# Patient Record
Sex: Female | Born: 1945 | Race: White | Hispanic: No | Marital: Married | Smoking: Former smoker
Health system: Southern US, Community
[De-identification: ages and names within clinical notes are randomized; demographics above are authoritative.]

## PROBLEM LIST (undated history)

## (undated) DIAGNOSIS — E039 Hypothyroidism, unspecified: Secondary | ICD-10-CM

## (undated) DIAGNOSIS — IMO0002 Reserved for concepts with insufficient information to code with codable children: Secondary | ICD-10-CM

## (undated) DIAGNOSIS — M069 Rheumatoid arthritis, unspecified: Secondary | ICD-10-CM

## (undated) DIAGNOSIS — F32A Depression, unspecified: Secondary | ICD-10-CM

## (undated) DIAGNOSIS — F329 Major depressive disorder, single episode, unspecified: Secondary | ICD-10-CM

## (undated) HISTORY — PX: WISDOM TOOTH EXTRACTION: SHX21

## (undated) HISTORY — PX: ABDOMINAL HYSTERECTOMY: SUR658

## (undated) HISTORY — PX: ABDOMINAL HYSTERECTOMY: SHX81

## (undated) HISTORY — PX: TONSILLECTOMY: SUR1361

---

## 1999-02-07 ENCOUNTER — Encounter: Payer: Self-pay | Admitting: Obstetrics and Gynecology

## 1999-02-07 ENCOUNTER — Encounter: Admission: RE | Admit: 1999-02-07 | Discharge: 1999-02-07 | Payer: Self-pay | Admitting: Obstetrics and Gynecology

## 2000-03-06 ENCOUNTER — Encounter: Payer: Self-pay | Admitting: Obstetrics and Gynecology

## 2000-03-06 ENCOUNTER — Encounter: Admission: RE | Admit: 2000-03-06 | Discharge: 2000-03-06 | Payer: Self-pay | Admitting: Obstetrics and Gynecology

## 2001-01-23 ENCOUNTER — Other Ambulatory Visit: Admission: RE | Admit: 2001-01-23 | Discharge: 2001-01-23 | Payer: Self-pay | Admitting: Obstetrics and Gynecology

## 2001-02-28 ENCOUNTER — Encounter: Admission: RE | Admit: 2001-02-28 | Discharge: 2001-02-28 | Payer: Self-pay

## 2001-03-12 ENCOUNTER — Encounter: Payer: Self-pay | Admitting: Obstetrics and Gynecology

## 2001-03-12 ENCOUNTER — Encounter: Admission: RE | Admit: 2001-03-12 | Discharge: 2001-03-12 | Payer: Self-pay | Admitting: Obstetrics and Gynecology

## 2002-03-10 ENCOUNTER — Encounter: Admission: RE | Admit: 2002-03-10 | Discharge: 2002-03-10 | Payer: Self-pay | Admitting: Gynecology

## 2002-03-10 ENCOUNTER — Encounter: Payer: Self-pay | Admitting: Gynecology

## 2002-03-13 ENCOUNTER — Other Ambulatory Visit: Admission: RE | Admit: 2002-03-13 | Discharge: 2002-03-13 | Payer: Self-pay | Admitting: Gynecology

## 2002-06-03 ENCOUNTER — Ambulatory Visit (HOSPITAL_COMMUNITY): Admission: RE | Admit: 2002-06-03 | Discharge: 2002-06-03 | Payer: Self-pay

## 2002-06-16 ENCOUNTER — Encounter: Admission: RE | Admit: 2002-06-16 | Discharge: 2002-06-16 | Payer: Self-pay

## 2002-06-17 ENCOUNTER — Encounter: Admission: RE | Admit: 2002-06-17 | Discharge: 2002-06-17 | Payer: Self-pay

## 2003-03-17 ENCOUNTER — Other Ambulatory Visit: Admission: RE | Admit: 2003-03-17 | Discharge: 2003-03-17 | Payer: Self-pay | Admitting: Gynecology

## 2003-04-02 ENCOUNTER — Encounter: Admission: RE | Admit: 2003-04-02 | Discharge: 2003-04-02 | Payer: Self-pay | Admitting: Gynecology

## 2004-03-17 ENCOUNTER — Other Ambulatory Visit: Admission: RE | Admit: 2004-03-17 | Discharge: 2004-03-17 | Payer: Self-pay | Admitting: Gynecology

## 2004-04-12 ENCOUNTER — Encounter: Admission: RE | Admit: 2004-04-12 | Discharge: 2004-04-12 | Payer: Self-pay | Admitting: Gynecology

## 2004-04-22 ENCOUNTER — Encounter: Admission: RE | Admit: 2004-04-22 | Discharge: 2004-04-22 | Payer: Self-pay | Admitting: Gynecology

## 2004-09-07 ENCOUNTER — Ambulatory Visit (HOSPITAL_COMMUNITY): Admission: RE | Admit: 2004-09-07 | Discharge: 2004-09-07 | Payer: Self-pay | Admitting: Gastroenterology

## 2005-03-28 ENCOUNTER — Other Ambulatory Visit: Admission: RE | Admit: 2005-03-28 | Discharge: 2005-03-28 | Payer: Self-pay | Admitting: Gynecology

## 2005-04-26 ENCOUNTER — Encounter: Admission: RE | Admit: 2005-04-26 | Discharge: 2005-04-26 | Payer: Self-pay | Admitting: Gynecology

## 2005-05-16 ENCOUNTER — Encounter: Admission: RE | Admit: 2005-05-16 | Discharge: 2005-05-16 | Payer: Self-pay

## 2005-08-30 ENCOUNTER — Encounter: Admission: RE | Admit: 2005-08-30 | Discharge: 2005-08-30 | Payer: Self-pay | Admitting: Internal Medicine

## 2006-04-02 ENCOUNTER — Other Ambulatory Visit: Admission: RE | Admit: 2006-04-02 | Discharge: 2006-04-02 | Payer: Self-pay | Admitting: Gynecology

## 2006-04-27 ENCOUNTER — Encounter: Admission: RE | Admit: 2006-04-27 | Discharge: 2006-04-27 | Payer: Self-pay | Admitting: Gynecology

## 2007-04-02 ENCOUNTER — Encounter: Admission: RE | Admit: 2007-04-02 | Discharge: 2007-04-02 | Payer: Self-pay | Admitting: Internal Medicine

## 2007-04-09 ENCOUNTER — Other Ambulatory Visit: Admission: RE | Admit: 2007-04-09 | Discharge: 2007-04-09 | Payer: Self-pay | Admitting: Gynecology

## 2007-04-29 ENCOUNTER — Encounter: Admission: RE | Admit: 2007-04-29 | Discharge: 2007-04-29 | Payer: Self-pay | Admitting: Gynecology

## 2007-05-10 ENCOUNTER — Encounter: Admission: RE | Admit: 2007-05-10 | Discharge: 2007-05-10 | Payer: Self-pay | Admitting: Gynecology

## 2008-04-29 ENCOUNTER — Encounter: Admission: RE | Admit: 2008-04-29 | Discharge: 2008-04-29 | Payer: Self-pay | Admitting: Internal Medicine

## 2008-10-05 ENCOUNTER — Encounter: Admission: RE | Admit: 2008-10-05 | Discharge: 2008-10-05 | Payer: Self-pay | Admitting: Internal Medicine

## 2010-04-30 ENCOUNTER — Encounter: Payer: Self-pay | Admitting: Gynecology

## 2010-05-01 ENCOUNTER — Encounter: Payer: Self-pay | Admitting: Internal Medicine

## 2010-05-18 ENCOUNTER — Other Ambulatory Visit: Payer: Self-pay | Admitting: Internal Medicine

## 2010-05-18 DIAGNOSIS — Z1239 Encounter for other screening for malignant neoplasm of breast: Secondary | ICD-10-CM

## 2010-05-25 ENCOUNTER — Ambulatory Visit
Admission: RE | Admit: 2010-05-25 | Discharge: 2010-05-25 | Disposition: A | Discharge status: Home / Self Care | Payer: Medicare Other | Source: Ambulatory Visit | Attending: Internal Medicine | Admitting: Internal Medicine

## 2010-05-25 DIAGNOSIS — Z1239 Encounter for other screening for malignant neoplasm of breast: Secondary | ICD-10-CM

## 2010-06-01 ENCOUNTER — Ambulatory Visit (HOSPITAL_COMMUNITY): Payer: Medicare Other | Attending: Internal Medicine

## 2010-06-01 DIAGNOSIS — M810 Age-related osteoporosis without current pathological fracture: Secondary | ICD-10-CM | POA: Insufficient documentation

## 2010-07-05 ENCOUNTER — Emergency Department (HOSPITAL_COMMUNITY): Payer: Medicare Other

## 2010-07-05 ENCOUNTER — Inpatient Hospital Stay (HOSPITAL_COMMUNITY)
Admission: EM | Admit: 2010-07-05 | Discharge: 2010-07-12 | DRG: 552 | Disposition: A | Discharge status: Skilled Nursing Facility | Payer: Medicare Other | Attending: Internal Medicine | Admitting: Internal Medicine

## 2010-07-05 DIAGNOSIS — K5909 Other constipation: Secondary | ICD-10-CM | POA: Diagnosis not present

## 2010-07-05 DIAGNOSIS — M545 Low back pain, unspecified: Secondary | ICD-10-CM | POA: Diagnosis present

## 2010-07-05 DIAGNOSIS — M810 Age-related osteoporosis without current pathological fracture: Secondary | ICD-10-CM | POA: Diagnosis present

## 2010-07-05 DIAGNOSIS — S3210XA Unspecified fracture of sacrum, initial encounter for closed fracture: Principal | ICD-10-CM | POA: Diagnosis present

## 2010-07-05 DIAGNOSIS — T40605A Adverse effect of unspecified narcotics, initial encounter: Secondary | ICD-10-CM | POA: Diagnosis not present

## 2010-07-05 DIAGNOSIS — S32509A Unspecified fracture of unspecified pubis, initial encounter for closed fracture: Secondary | ICD-10-CM | POA: Diagnosis present

## 2010-07-05 DIAGNOSIS — S322XXA Fracture of coccyx, initial encounter for closed fracture: Principal | ICD-10-CM | POA: Diagnosis present

## 2010-07-05 DIAGNOSIS — E039 Hypothyroidism, unspecified: Secondary | ICD-10-CM | POA: Diagnosis present

## 2010-07-05 DIAGNOSIS — G8929 Other chronic pain: Secondary | ICD-10-CM | POA: Diagnosis present

## 2010-07-05 DIAGNOSIS — Y92009 Unspecified place in unspecified non-institutional (private) residence as the place of occurrence of the external cause: Secondary | ICD-10-CM

## 2010-07-05 DIAGNOSIS — W06XXXA Fall from bed, initial encounter: Secondary | ICD-10-CM | POA: Diagnosis present

## 2010-07-05 DIAGNOSIS — D72829 Elevated white blood cell count, unspecified: Secondary | ICD-10-CM | POA: Diagnosis present

## 2010-07-05 DIAGNOSIS — M069 Rheumatoid arthritis, unspecified: Secondary | ICD-10-CM | POA: Diagnosis present

## 2010-07-05 DIAGNOSIS — I10 Essential (primary) hypertension: Secondary | ICD-10-CM | POA: Diagnosis present

## 2010-07-05 DIAGNOSIS — Z79899 Other long term (current) drug therapy: Secondary | ICD-10-CM

## 2010-07-05 DIAGNOSIS — E871 Hypo-osmolality and hyponatremia: Secondary | ICD-10-CM | POA: Diagnosis present

## 2010-07-05 DIAGNOSIS — Z7982 Long term (current) use of aspirin: Secondary | ICD-10-CM

## 2010-07-05 LAB — COMPREHENSIVE METABOLIC PANEL
ALT: 36 U/L — ABNORMAL HIGH (ref 0–35)
Albumin: 3.7 g/dL (ref 3.5–5.2)
Alkaline Phosphatase: 51 U/L (ref 39–117)
Chloride: 95 mEq/L — ABNORMAL LOW (ref 96–112)
Glucose, Bld: 145 mg/dL — ABNORMAL HIGH (ref 70–99)
Potassium: 4.4 mEq/L (ref 3.5–5.1)
Sodium: 130 mEq/L — ABNORMAL LOW (ref 135–145)
Total Protein: 6.3 g/dL (ref 6.0–8.3)

## 2010-07-05 LAB — CBC
HCT: 35.1 % — ABNORMAL LOW (ref 36.0–46.0)
Hemoglobin: 12.4 g/dL (ref 12.0–15.0)
MCH: 32.6 pg (ref 26.0–34.0)
RBC: 3.8 MIL/uL — ABNORMAL LOW (ref 3.87–5.11)

## 2010-07-05 LAB — SAMPLE TO BLOOD BANK

## 2010-07-05 LAB — DIFFERENTIAL
Basophils Relative: 0 % (ref 0–1)
Lymphocytes Relative: 4 % — ABNORMAL LOW (ref 12–46)
Monocytes Relative: 4 % (ref 3–12)
Neutro Abs: 12.4 10*3/uL — ABNORMAL HIGH (ref 1.7–7.7)
Neutrophils Relative %: 92 % — ABNORMAL HIGH (ref 43–77)

## 2010-07-05 LAB — URINALYSIS, ROUTINE W REFLEX MICROSCOPIC
Glucose, UA: NEGATIVE mg/dL
Hgb urine dipstick: NEGATIVE
Ketones, ur: 15 mg/dL — AB
Protein, ur: NEGATIVE mg/dL

## 2010-07-06 ENCOUNTER — Inpatient Hospital Stay (HOSPITAL_COMMUNITY): Payer: Medicare Other

## 2010-07-06 LAB — URINALYSIS, MICROSCOPIC ONLY
Ketones, ur: 15 mg/dL — AB
Leukocytes, UA: NEGATIVE
Nitrite: NEGATIVE
Protein, ur: NEGATIVE mg/dL
pH: 7.5 (ref 5.0–8.0)

## 2010-07-06 LAB — DIFFERENTIAL
Basophils Absolute: 0 10*3/uL (ref 0.0–0.1)
Basophils Relative: 0 % (ref 0–1)
Eosinophils Relative: 0 % (ref 0–5)
Monocytes Absolute: 0.4 10*3/uL (ref 0.1–1.0)

## 2010-07-06 LAB — VITAMIN B12: Vitamin B-12: 1068 pg/mL — ABNORMAL HIGH (ref 211–911)

## 2010-07-06 LAB — TSH: TSH: 1.973 u[IU]/mL (ref 0.350–4.500)

## 2010-07-06 LAB — CBC
MCHC: 35.4 g/dL (ref 30.0–36.0)
Platelets: 142 10*3/uL — ABNORMAL LOW (ref 150–400)
RDW: 13.1 % (ref 11.5–15.5)
WBC: 6.5 10*3/uL (ref 4.0–10.5)

## 2010-07-06 LAB — URINE CULTURE

## 2010-07-07 ENCOUNTER — Inpatient Hospital Stay (HOSPITAL_COMMUNITY): Payer: Medicare Other

## 2010-07-07 LAB — BASIC METABOLIC PANEL
CO2: 26 mEq/L (ref 19–32)
Chloride: 97 mEq/L (ref 96–112)
GFR calc non Af Amer: >60 mL/min (ref >60)
Glucose, Bld: 92 mg/dL (ref 70–99)
Potassium: 4.1 mEq/L (ref 3.5–5.1)
Sodium: 132 mEq/L — ABNORMAL LOW (ref 135–145)

## 2010-07-07 LAB — CBC
HCT: 35 % — ABNORMAL LOW (ref 36.0–46.0)
Hemoglobin: 12.4 g/dL (ref 12.0–15.0)
RBC: 3.78 MIL/uL — ABNORMAL LOW (ref 3.87–5.11)
RDW: 12.9 % (ref 11.5–15.5)
WBC: 6.4 10*3/uL (ref 4.0–10.5)

## 2010-07-09 LAB — BASIC METABOLIC PANEL
CO2: 29 mEq/L (ref 19–32)
Calcium: 8.9 mg/dL (ref 8.4–10.5)
Glucose, Bld: 93 mg/dL (ref 70–99)
Potassium: 4.7 mEq/L (ref 3.5–5.1)
Sodium: 133 mEq/L — ABNORMAL LOW (ref 135–145)

## 2010-07-09 LAB — CBC
HCT: 34.9 % — ABNORMAL LOW (ref 36.0–46.0)
Hemoglobin: 12.3 g/dL (ref 12.0–15.0)
MCHC: 35.2 g/dL (ref 30.0–36.0)
MCV: 92.8 fL (ref 78.0–100.0)

## 2010-07-13 NOTE — Discharge Summary (Signed)
[name], KNIPPENBERG                ACCOUNT NO.:  [id number]  MEDICAL RECORD NO.:  [id number]           PATIENT TYPE:  I  LOCATION:  5151                         FACILITY:  [facility]  PHYSICIAN:  Thad Ranger, MD       DATE OF BIRTH:  [birth year]  DATE OF ADMISSION:  07/05/2010 DATE OF DISCHARGE:                              DISCHARGE SUMMARY   PRIMARY CARE PHYSICIAN:  Juline Patch, MD  DISCHARGE DIAGNOSES: 1. Mechanical fall. 2. Nondisplaced right superior and inferior pubic rami fractures. 3. Right sacral ala fracture. 4. Hypertension. 5. Hypothyroidism. 6. History of rheumatoid arthritis. 7. History of chronic low back pain. 8. Hypothyroidism. 9. Constipation.  CONSULTATIONS:  Orthopedics, Toni Arthurs, MD  DISCHARGE MEDICATIONS: 1. Tylenol Extra Strength 500 mg p.o. t.i.d. as needed for pain. 2. Colace 100 mg p.o. daily. 3. Robaxin 500 mg p.o. every 6 hours as needed for pain. 4. Percocet 7.5/325 mg 1-2 tablets p.o. every 6 hours as needed for     pain, 15 tablets dispensed. Facility MD to refill. 5. MiraLax 17 g p.o. daily as needed. 6. Senokot-S 2 tablets p.o. daily. 7. Aspirin 81 mg daily. 8. Bystolic 5 mg p.o. daily at bedtime. 9. Calcium citrate/vitamin D 1 tablet p.o. b.i.d. 10.Cyclobenzaprine 10 mg p.o. b.i.d. as needed for pain. 11.Cymbalta 60 mg p.o. daily. 12.Enbrel (etanercept) 50 mg intramuscular Tuesday. 13.Fluticasone 50 mcg each nostril daily at bedtime. 14.Folic acid 0.4 mg p.o. daily. 15.Levoxyl 112 mcg 1 tablet p.o. daily. 16.Lidoderm patch 2 patches transdermal daily as needed. 17.Meloxicam 15 mg p.o. q.a.m. 18.Methotrexate 2.5 mg 6 tablets p.o. Monday. 19.Plaquenil (hydroxychloroquine) 200 mg p.o. b.i.d. 20.Prenatal plus vitamin 1 tablet p.o. daily. 21.Tramadol 50 mg p.o. twice a day as needed for pain. 22.Vitamin D3 1000 units 1 tablet p.o. twice daily. 23.Zovirax topical daily as needed when cold sores are present.  BRIEF HISTORY OF PRESENT  ILLNESS:  At the time of admission, [name] is a 65 year old female with history of rheumatoid arthritis, osteoporosis, hypothyroidism who fell a day before the admission while getting out of the bed.  She was unable to get up after falling.  She called the EMS and was brought to the emergency room.  She was found to have nondisplaced superior and inferior pubic rami fractures.  The patient also reported recurrent falls over the last few months, at least 4 different episodes, blamed on loss of balance and poor coordination.  RADIOLOGICAL DATA: 1. Chest x-ray, March 27.  Peribronchial thickening which can be seen     with a chronic bronchitis. 2. Right hip x-ray, March 27.  Nondisplaced superior and inferior     medial right pubic rami fractures. 3. CT pelvis without contrast, March 27.  Acute fractures of the right     superior and inferior pubic rami and associated right sacral ala     fracture, 2.5 cm right adnexal cystic lesion.  Pelvic ultrasound     could be used to further characterize. 4. Pelvic ultrasound showed 2.9-cm cyst in the right ovary shows a     thin internal peripheral septations.  No overtly suspicious  feature     such as mural nodular papillary excrescences.  Followup ultrasound     in 6 weeks recommended. 5. Right knee x-ray negative. 6. Right tibia-fibula x-ray negative for any fractures. 7. Right ankle x-ray negative. 8. Pelvic x-ray on March 29.  No evidence of distraction involving the     superior and inferior right pubic rami fractures.  S1 joint is     intact.  BRIEF HOSPITALIZATION COURSE:  [name] is a 65 year old female who presented with mechanical fall and subsequent pelvic fracture. 1. Mechanical fall with right superior and inferior nondisplaced     pelvic fracture with right sacral ala fracture.  The patient was     admitted to the Medicine Service and Orthopedic was consulted and     the patient was seen and followed by Dr. Toni Arthurs.   The patient     underwent multiple imaging as she continued to complain of pain     which did not show any other fractures.  She was started on     physical therapy.  Weightbearing as tolerated and she requires a     short-term rehab for strengthening.  She will follow up in 2 weeks     with Dr. Toni Arthurs. 2. Constipation secondary to pain medication.  The patient did have     significant constipation during the hospitalization.  She is doing     well on stool softeners.  Continue Colace and Senokot-S tablet     daily with p.r.n. MiraLax. 3. History of rheumatoid arthritis.  The patient is on etanercept,     Plaquenil and methotrexate. The patient will be discharged to short-     term rehab today, pending bed available.  PHYSICAL EXAMINATION AT TIME OF DICTATION:  VITAL SIGNS:  Temperature 98.2, pulse 68, respirations 16, blood pressure 113/62, O2 sats 97% on room air. GENERAL:  The patient is alert, awake and oriented x3 not in acute distress. HEENT:  Anicteric sclarea, pink conjunctiva.  Pupils reactive to light accommodation.  EOMI.  NECK:  Supple.  No lymphopathy.  No JVD. CVS:  S1 and S2 clear.  Regular rate and rhythm. CHEST:  Clear to auscultation bilaterally. ABDOMEN:  Soft, nontender, and nondistended.  Normal bowel sounds. EXTREMITIES:  No cyanosis, clubbing, or edema noted in the upper or lower extremities bilaterally.  DISCHARGE FOLLOWUP: 1. With Dr. Juline Patch in 3-4 weeks, outpatient pelvic ultrasound in     6 weeks. 2. Dr. Toni Arthurs in 2 weeks.  DISCHARGE TIME:  35 minutes.     Thad Ranger, MD     RR/MEDQ  D:  07/11/2010  T:  07/11/2010  Job:  [id number]  cc:   Juline Patch, M.D. Toni Arthurs, MD  Electronically Signed by Andres Labrum Nadir Vasques  on 07/13/2010 04:46:28 PM

## 2010-07-18 NOTE — H&P (Signed)
[name], [name]                ACCOUNT NO.:  [id number]  MEDICAL RECORD NO.:  [id number]           PATIENT TYPE:  E  LOCATION:  [facility]                         FACILITY:  [facility]  PHYSICIAN:  Homero Fellers, MD   DATE OF BIRTH:  October 24, 1945  DATE OF ADMISSION:  07/05/2010 DATE OF DISCHARGE:                             HISTORY & PHYSICAL   PRIMARY CARE PHYSICIAN:  Juline Patch, MD  CHIEF COMPLAINT:  Fall.  HISTORY OF PRESENT ILLNESS:  This is a 65 year old woman who fell from a height bed to a carpeted floor and sustained injury to her right hip region.  When EMS arrived, the patient was given fentanyl and Zofran and in the emergency room she got full evaluation including x-ray of the right hip which showed nondisplaced superior and inferior medial right pubic rami.  She also had CAT scan of the pelvis without contrast which showed no evidence of pelvic organ damage and confirmed undisplaced fracture of the right superior and inferior pubic rami as described above.  Incidentally, there was also found a 2.5-cm right adnexal cystic lesion.  The patient denies any chest pain, shortness of breath, nausea or vomiting.  She also denies any loss of consciousness prior to fall. Aforementioned incident was described by the patient trying to answer the telephone and then she lost her balance and fell.  The patient has also had recurrent falls over the past few months at least 4 different episodes including today's episode of blamed on loss of balance and probably poor coordination.  PAST MEDICAL HISTORY:  Includes 1. High blood pressure. 2. Hypothyroidism. 3. Rheumatoid arthritis. 4. History of low back pain, status post steroid injection about 6     weeks ago.  MEDICATION.: 1. Plaquenil. 2. Methotrexate. 3. Enbrel. 4. Cymbalta. 5. Multivitamin. 6. Vitamin D3. 7. Calcium citrate. 8. Folic acid. 9.  Aspirin. 10.Cyclobenzaprine. 11.Levoxyl. 12.Bystolic. 13.Tramadol. 14.Meloxicam.  ALLERGIES:  SULFA.  SOCIAL HISTORY:  No smoking, alcohol or drug use.  FAMILY HISTORY:  Noncontributory.  REVIEW OF SYSTEMS:  A 10-point review of systems negative except as above.  PHYSICAL EXAMINATION:  VITAL SIGNS:  Blood pressure is 127/66, pulse 79, respirations 18, temperature 97.6, O2 sat 95%. GENERAL:  The patient is comfortable in no distress. HEENT:  Pallor.  Extraocular muscles are intact. NECK:  Supple.  No JVD, adenopathy or thyromegaly. LUNGS:  Clear to auscultation bilaterally.  No murmurs, rubs or gallops. ABDOMEN:  Full, soft, nondistended.  Bowel sounds present.  No masses. EXTREMITIES:  No edema, clubbing or cyanosis. NEUROLOGIC:  Alert and oriented x3.  No focal deficit identified. Speech is clear. MUSCULOSKELETAL:  She has some tenderness along the spine. SKIN:  No rash or lesion.  LABORATORY:  White count is 13.5 with a left shift, hemoglobin 12.41, platelet count is 149,000.  Sodium is 130, potassium 4.4, BUN 9, creatinine 0.79.  Liver enzymes normal.  Urinalysis pending.  Imaginghas been described above.  EKG normal sinus rhythm.  ASSESSMENT: 1. This 65 year old woman admitted after a fall following which she     sustained nondisplaced fracture to the right superior and inferior  pubic rami and right sacral ala fracture. 2. Leukocytosis of unclear etiology. 3. Right adnexal cystic lesion. 4. History of high blood pressure, fairly stable. 5. Hypothyroidism.  PLAN:  Admit to medical bed, start pain control and physical therapy. Orthopedic Service had been notified and will also see the patient.  I will also order a 2-D echo in view of recurrent falls.  I will also a get a TSH, B12, and folic acid levels.  Urinalysis is pending.  Overall condition is stable.  She will be on DVT prophylaxis.     Homero Fellers, MD     FA/MEDQ  D:  07/05/2010  T:   07/05/2010  Job:  [id number]  Electronically Signed by Homero Fellers  on 07/18/2010 09:15:33 PM

## 2010-07-29 NOTE — Consult Note (Signed)
[name], [name]                ACCOUNT NO.:  [id number]  MEDICAL RECORD NO.:  [id number]           PATIENT TYPE:  I  LOCATION:  5151                         FACILITY:  [facility]  PHYSICIAN:  Mary Arthurs, MD        DATE OF BIRTH:  07/13/1945  DATE OF CONSULTATION:  07/06/2010 DATE OF DISCHARGE:                                CONSULTATION   REASON FOR CONSULTATION:  Hip pain.  PRIMARY CARE PROVIDER:  Juline Patch, MD  RHEUMATOLOGIST:  Mary Deeds, MD  HISTORY OF PRESENT ILLNESS:  The patient is a 65 year old female with past medical history significant for rheumatoid arthritis and osteoporosis who fell yesterday morning getting out of bed.  She was unable to get up having fallen.  She called EMS.  She was brought to the emergency room and evaluated for this injury.  She was found to have nondisplaced superior and inferior pubic ramus fractures.  I was consulted for further evaluation of this injury.  The patient complains of dull aching pain deep in her right hip.  She says this is worse with moving her right leg around.  It feels better when she lays still.  She has not ambulated since her injury.  She denies numbness, tingling, or weakness in her right lower extremity. She recently started infusion therapy for her osteoporosis directed by [name].  She is not a smoker.  She is not diabetic.  PAST MEDICAL HISTORY:  Hypertension, hypothyroidism, rheumatoid arthritis, low back pain treated most recently by epidural steroid injection by [name] at [facility] and Sports Medicine. Osteoporosis.  SOCIAL HISTORY:  The patient is retired.  She does not use any tobacco products.  FAMILY HISTORY:  Positive for coronary artery disease.  REVIEW OF SYSTEMS:  No recent fever, chills, nausea, vomiting, or changes in her appetite.  Review of systems as above and otherwise negative.  PHYSICAL EXAMINATION:  The patient is well-nourished, well-developed woman in no  apparent distress, alert and oriented x4.  Mood and affect normal.  Extraocular motions are intact.  Respirations are unlabored. She is examined lying supine on a hospital bed.  The patient has no pain with anterior-posterior compression of her pelvis, but she does have some pain with medial lateral compression at greater trochanters.  The right hip has full range of motion with some mild pain passively.  With straight leg raises, it recreates the pain in the groin.  She has healthy and intact skin over the right lower extremity.  She has palpable pulses and normal sensibility to light touch throughout.  There is no lymphadenopathy noted about the right lower extremity.  She has 5/5 strength throughout as well.  X-rays, AP pelvis as well as CT scan are reviewed.  These show nondisplaced right sacral ala fracture as well as superior and inferior pubic ramus fractures on the right.  ASSESSMENT:  Nondisplaced pelvic ring fractures in a patient with osteoporosis and rheumatoid arthritis.  PLAN:  I explained the nature of these conditions to the patient in detail.  Based on her fracture pattern, this is likely to be stable injury.  Today we will start her with physical therapy, weightbearing as tolerated on both lower extremities.  We will follow up with an AP pelvis as well as inlet and outlet views tomorrow morning after she has been up and around today.  If her films show no significant change in position of the fracture fragments, then she can be weightbearing as tolerated as these fractures heal.  I will see her again tomorrow to assess how she is progressing.  This represents a significant injury for this patient with high risk of loss of functional ability.     Mary Arthurs, MD     JH/MEDQ  D:  07/06/2010  T:  07/06/2010  Job:  [id number]  cc:   Mary Deeds, MD [name], M.D.  Electronically Signed by [name]  on 07/29/2010 10:29:54 PM

## 2010-08-26 NOTE — Op Note (Signed)
[name], [name]                ACCOUNT NO.:  [id number]   MEDICAL RECORD NO.:  [id number]          PATIENT TYPE:  AMB   LOCATION:  ENDO                         FACILITY:  [facility]   PHYSICIAN:  Anselmo Rod, M.D.  DATE OF BIRTH:  1945/12/18   DATE OF PROCEDURE:  09/07/2004  DATE OF DISCHARGE:                                 OPERATIVE REPORT   PROCEDURE:  Screening colonoscopy.   ENDOSCOPIST:  Anselmo Rod, M.D.   INSTRUMENT:  Olympus videocolonoscope.   INDICATIONS FOR PROCEDURE:  A 65 year old white female with a history of  chronic constipation and occasional rectal bleeding undergoing screening  colonoscopy to rule out colonic polyps, masses, etcetera.   PREPROCEDURE PREPARATION:  Informed consent was procured from the patient.  The patient fasted for 8 hours prior to the procedure and prepped with a  bottle of magnesium citrate in a gallon of GoLYTELY the night prior to the  procedure.  The risks and benefits of the procedure including a 10% missed  rate of cancer and polyps were discussed with the patient.   PREPROCEDURE PHYSICAL:  VITAL SIGNS: The patient with stable vital signs.  NECK:  Supple.  CHEST:  Clear to auscultation.  HEART:  S1, S2 regular.  ABDOMEN:  Soft with normal bowel sounds.   DESCRIPTION OF PROCEDURE:  The patient was placed in the left lateral  decubitus position and sedated with 50 mg of Demerol and 5 mg of Versed in  slow incremental doses. Once the patient was adequately sedated, maintained  on low-flow oxygen, and continuous cardiac monitoring; the Olympus  videocolonoscope was advanced from the rectum to the cecum with difficulty.  There was a large amount of residual stool in the colon.  Multiple washes  were done, small lesions could be missed.  The appendiceal orifice and  ileocecal valve were visualized and photographed.  The terminal ileum was  briefly visualized and appeared normal.  There was no evidence of masses,  polyps, or  diverticulosis.   Retroflexion in the rectum revealed small internal hemorrhoids.  Small  external hemorrhoids were seen on withdrawal of the scope.  The patient  tolerated the procedure well without complications.   IMPRESSION:  1. Unrevealing colonoscopy up to the terminal ileum except for small      internal and external hemorrhoids.  2. Large amount of residual stool in the colon.  Small lesions could be      missed.  3. No masses, polyps, or diverticula seen.     RECOMMENDATIONS:  1. Continue a high fiber diet with liberal fluid intake.  2. Use stool softeners like Colace as need arises.  3. A repeat colonoscopy in the next 5 years or earlier if the patient      develops any abnormal symptoms in the interim.        JNM/MEDQ  D:  09/07/2004  T:  09/07/2004  Job:  [id number]   cc:   Gretta Cool, M.D.  [address] [place]  [place] [address]  Fax: [phone]

## 2010-09-30 ENCOUNTER — Other Ambulatory Visit: Payer: Self-pay | Admitting: Internal Medicine

## 2010-09-30 DIAGNOSIS — N949 Unspecified condition associated with female genital organs and menstrual cycle: Secondary | ICD-10-CM

## 2010-10-03 ENCOUNTER — Ambulatory Visit
Admission: RE | Admit: 2010-10-03 | Discharge: 2010-10-03 | Disposition: A | Discharge status: Home / Self Care | Payer: Medicare Other | Source: Ambulatory Visit | Attending: Internal Medicine | Admitting: Internal Medicine

## 2010-10-03 DIAGNOSIS — N949 Unspecified condition associated with female genital organs and menstrual cycle: Secondary | ICD-10-CM

## 2010-11-01 ENCOUNTER — Ambulatory Visit (HOSPITAL_COMMUNITY)
Admission: RE | Admit: 2010-11-01 | Discharge: 2010-11-01 | Disposition: A | Discharge status: Home / Self Care | Payer: Medicare Other | Source: Ambulatory Visit | Attending: Internal Medicine | Admitting: Internal Medicine

## 2010-11-01 DIAGNOSIS — G43909 Migraine, unspecified, not intractable, without status migrainosus: Secondary | ICD-10-CM | POA: Insufficient documentation

## 2010-11-01 DIAGNOSIS — Z9181 History of falling: Secondary | ICD-10-CM | POA: Insufficient documentation

## 2010-11-02 NOTE — Procedures (Signed)
EEG NUMBER:  HISTORY:  A 66 year old female with history of migraines and falls.  MEDICATIONS:  Plaquenil, methotrexate, Enbrel, Cymbalta, Prenatal Plus, vitamin D3, citrate, folic acid, aspirin, Levoxyl, Lidoderm, tramadol, and fluticasone propionate.  CONDITIONS OF RECORDING:  This is a 16-channel EEG carried out with the patient in the awake state.  DESCRIPTION:  The waking background activity consists of a low-voltage, symmetrical, fairly well-organized 9 Hz alpha activity seen from the parieto-occipital and posterior temporal regions.  Low-voltage, fast activity, poorly organized was seen anteriorly and at times superimposed on more posterior rhythms.  A mixture of theta and alpha rhythm was seen from the central and temporal regions.  The patient does not drowse or sleep.  Hypoventilation was performed and produced a mild-to-moderate buildup but failed to elicit any abnormalities.  Intermittent photic stimulation was performed and elicited a symmetrical driving response but failed to elicit any abnormalities.  IMPRESSION:  This is a normal awake EEG.  COMMENT:  In EEG, the patient sleep deprived to elicit drowsing and light sleep, may be desirable to further elicit a possible seizure disorder.          ______________________________ Thana Farr, MD    ZO:XWRU D:  11/01/2010 17:55:34  T:  11/02/2010 00:09:02  Job #:  [id number]

## 2011-02-14 ENCOUNTER — Ambulatory Visit: Payer: Medicare Other | Attending: Neurology | Admitting: Physical Therapy

## 2011-02-14 DIAGNOSIS — M6281 Muscle weakness (generalized): Secondary | ICD-10-CM | POA: Insufficient documentation

## 2011-02-14 DIAGNOSIS — R269 Unspecified abnormalities of gait and mobility: Secondary | ICD-10-CM | POA: Insufficient documentation

## 2011-02-14 DIAGNOSIS — IMO0001 Reserved for inherently not codable concepts without codable children: Secondary | ICD-10-CM | POA: Insufficient documentation

## 2011-02-20 ENCOUNTER — Ambulatory Visit: Payer: Medicare Other | Admitting: Physical Therapy

## 2011-02-22 ENCOUNTER — Ambulatory Visit: Payer: Medicare Other | Admitting: Physical Therapy

## 2011-02-27 ENCOUNTER — Ambulatory Visit: Payer: Medicare Other | Admitting: Physical Therapy

## 2011-03-06 ENCOUNTER — Ambulatory Visit: Payer: Medicare Other | Admitting: Physical Therapy

## 2011-03-08 ENCOUNTER — Ambulatory Visit: Payer: Medicare Other | Admitting: Physical Therapy

## 2011-03-13 ENCOUNTER — Ambulatory Visit: Payer: Medicare Other | Attending: Neurology | Admitting: Physical Therapy

## 2011-03-13 DIAGNOSIS — M6281 Muscle weakness (generalized): Secondary | ICD-10-CM | POA: Insufficient documentation

## 2011-03-13 DIAGNOSIS — IMO0001 Reserved for inherently not codable concepts without codable children: Secondary | ICD-10-CM | POA: Insufficient documentation

## 2011-03-13 DIAGNOSIS — R269 Unspecified abnormalities of gait and mobility: Secondary | ICD-10-CM | POA: Insufficient documentation

## 2011-03-15 ENCOUNTER — Ambulatory Visit: Payer: Medicare Other | Admitting: Physical Therapy

## 2011-03-20 ENCOUNTER — Ambulatory Visit: Payer: Medicare Other | Admitting: Physical Therapy

## 2011-03-22 ENCOUNTER — Encounter: Payer: Medicare Other | Admitting: Physical Therapy

## 2011-03-24 ENCOUNTER — Ambulatory Visit: Payer: Medicare Other | Admitting: Rehabilitative and Restorative Service Providers"

## 2011-03-27 ENCOUNTER — Ambulatory Visit: Payer: Medicare Other | Admitting: Physical Therapy

## 2011-03-30 ENCOUNTER — Ambulatory Visit: Payer: Medicare Other | Admitting: Physical Therapy

## 2011-04-12 ENCOUNTER — Ambulatory Visit: Payer: Medicare Other | Attending: Neurology | Admitting: Physical Therapy

## 2011-04-12 DIAGNOSIS — IMO0001 Reserved for inherently not codable concepts without codable children: Secondary | ICD-10-CM | POA: Insufficient documentation

## 2011-04-12 DIAGNOSIS — R269 Unspecified abnormalities of gait and mobility: Secondary | ICD-10-CM | POA: Insufficient documentation

## 2011-04-12 DIAGNOSIS — M6281 Muscle weakness (generalized): Secondary | ICD-10-CM | POA: Insufficient documentation

## 2011-04-14 ENCOUNTER — Ambulatory Visit: Payer: Medicare Other | Admitting: Physical Therapy

## 2011-04-17 ENCOUNTER — Other Ambulatory Visit: Payer: Self-pay | Admitting: Neurology

## 2011-04-17 ENCOUNTER — Ambulatory Visit: Payer: Medicare Other | Admitting: Physical Therapy

## 2011-04-17 DIAGNOSIS — R2689 Other abnormalities of gait and mobility: Secondary | ICD-10-CM

## 2011-04-19 ENCOUNTER — Ambulatory Visit: Payer: Medicare Other | Admitting: Physical Therapy

## 2011-04-20 ENCOUNTER — Other Ambulatory Visit: Payer: Medicare Other

## 2011-04-24 ENCOUNTER — Ambulatory Visit: Payer: Medicare Other | Admitting: Physical Therapy

## 2011-04-25 ENCOUNTER — Ambulatory Visit
Admission: RE | Admit: 2011-04-25 | Discharge: 2011-04-25 | Disposition: A | Discharge status: Home / Self Care | Payer: Medicare Other | Source: Ambulatory Visit | Attending: Neurology | Admitting: Neurology

## 2011-04-25 DIAGNOSIS — R2689 Other abnormalities of gait and mobility: Secondary | ICD-10-CM

## 2011-04-27 ENCOUNTER — Encounter: Payer: Medicare Other | Admitting: Physical Therapy

## 2011-05-01 ENCOUNTER — Encounter: Payer: Medicare Other | Admitting: Physical Therapy

## 2011-05-04 ENCOUNTER — Encounter: Payer: Medicare Other | Admitting: Physical Therapy

## 2011-05-08 ENCOUNTER — Encounter: Payer: Medicare Other | Admitting: Physical Therapy

## 2011-05-11 ENCOUNTER — Encounter: Payer: Medicare Other | Admitting: Physical Therapy

## 2011-05-15 ENCOUNTER — Encounter (HOSPITAL_COMMUNITY)
Admission: RE | Admit: 2011-05-15 | Discharge: 2011-05-15 | Disposition: A | Discharge status: Home / Self Care | Payer: Medicare Other | Source: Ambulatory Visit | Attending: Internal Medicine | Admitting: Internal Medicine

## 2011-05-15 ENCOUNTER — Encounter: Payer: Medicare Other | Admitting: Physical Therapy

## 2011-05-15 ENCOUNTER — Encounter (HOSPITAL_COMMUNITY): Payer: Self-pay

## 2011-05-15 DIAGNOSIS — M810 Age-related osteoporosis without current pathological fracture: Secondary | ICD-10-CM | POA: Insufficient documentation

## 2011-05-15 HISTORY — DX: Depression, unspecified: F32.A

## 2011-05-15 HISTORY — DX: Reserved for concepts with insufficient information to code with codable children: IMO0002

## 2011-05-15 HISTORY — DX: Major depressive disorder, single episode, unspecified: F32.9

## 2011-05-15 HISTORY — DX: Rheumatoid arthritis, unspecified: M06.9

## 2011-05-15 HISTORY — DX: Hypothyroidism, unspecified: E03.9

## 2011-05-15 MED ORDER — ZOLEDRONIC ACID 5 MG/100ML IV SOLN
5.0000 mg | INTRAVENOUS | Status: AC
  Administered 2011-05-15: 5 mg via INTRAVENOUS
  Filled 2011-05-15: qty 100

## 2011-05-15 MED ORDER — SODIUM CHLORIDE 0.9 % IV SOLN
INTRAVENOUS | Status: DC
  Administered 2011-05-15: 11:00:00 via INTRAVENOUS

## 2011-05-18 ENCOUNTER — Encounter: Payer: Medicare Other | Admitting: Physical Therapy

## 2011-05-22 ENCOUNTER — Encounter: Payer: Medicare Other | Admitting: Physical Therapy

## 2011-05-25 ENCOUNTER — Encounter: Payer: Medicare Other | Admitting: Physical Therapy

## 2011-08-20 ENCOUNTER — Emergency Department (HOSPITAL_COMMUNITY): Payer: Medicare Other

## 2011-08-20 ENCOUNTER — Encounter (HOSPITAL_COMMUNITY): Payer: Self-pay | Admitting: Emergency Medicine

## 2011-08-20 ENCOUNTER — Inpatient Hospital Stay (HOSPITAL_COMMUNITY)
Admission: EM | Admit: 2011-08-20 | Discharge: 2011-08-25 | DRG: 493 | Disposition: A | Discharge status: Skilled Nursing Facility | Payer: Medicare Other | Attending: Orthopaedic Surgery | Admitting: Orthopaedic Surgery

## 2011-08-20 DIAGNOSIS — E039 Hypothyroidism, unspecified: Secondary | ICD-10-CM | POA: Diagnosis present

## 2011-08-20 DIAGNOSIS — D62 Acute posthemorrhagic anemia: Secondary | ICD-10-CM | POA: Diagnosis not present

## 2011-08-20 DIAGNOSIS — S42309A Unspecified fracture of shaft of humerus, unspecified arm, initial encounter for closed fracture: Secondary | ICD-10-CM

## 2011-08-20 DIAGNOSIS — M810 Age-related osteoporosis without current pathological fracture: Secondary | ICD-10-CM | POA: Diagnosis present

## 2011-08-20 DIAGNOSIS — F3289 Other specified depressive episodes: Secondary | ICD-10-CM | POA: Diagnosis present

## 2011-08-20 DIAGNOSIS — W010XXA Fall on same level from slipping, tripping and stumbling without subsequent striking against object, initial encounter: Secondary | ICD-10-CM | POA: Diagnosis present

## 2011-08-20 DIAGNOSIS — M069 Rheumatoid arthritis, unspecified: Secondary | ICD-10-CM | POA: Diagnosis present

## 2011-08-20 DIAGNOSIS — F329 Major depressive disorder, single episode, unspecified: Secondary | ICD-10-CM | POA: Diagnosis present

## 2011-08-20 DIAGNOSIS — Y9229 Other specified public building as the place of occurrence of the external cause: Secondary | ICD-10-CM

## 2011-08-20 LAB — CBC
MCV: 94.2 fL (ref 78.0–100.0)
Platelets: 192 10*3/uL (ref 150–400)
RBC: 3.82 MIL/uL — ABNORMAL LOW (ref 3.87–5.11)
WBC: 6.8 10*3/uL (ref 4.0–10.5)

## 2011-08-20 LAB — DIFFERENTIAL
Eosinophils Relative: 0 % (ref 0–5)
Lymphocytes Relative: 18 % (ref 12–46)
Lymphs Abs: 1.2 10*3/uL (ref 0.7–4.0)
Neutrophils Relative %: 74 % (ref 43–77)

## 2011-08-20 LAB — COMPREHENSIVE METABOLIC PANEL
Albumin: 3.6 g/dL (ref 3.5–5.2)
BUN: 9 mg/dL (ref 6–23)
Calcium: 8.1 mg/dL — ABNORMAL LOW (ref 8.4–10.5)
Chloride: 97 mEq/L (ref 96–112)
Creatinine, Ser: 0.52 mg/dL (ref 0.50–1.10)
Total Bilirubin: 0.3 mg/dL (ref 0.3–1.2)

## 2011-08-20 LAB — POCT I-STAT, CHEM 8
HCT: 36 % (ref 36.0–46.0)
Hemoglobin: 12.2 g/dL (ref 12.0–15.0)
Potassium: 5 mEq/L (ref 3.5–5.1)
Sodium: 130 mEq/L — ABNORMAL LOW (ref 135–145)

## 2011-08-20 MED ORDER — ONDANSETRON HCL 4 MG/2ML IJ SOLN
4.0000 mg | Freq: Four times a day (QID) | INTRAMUSCULAR | Status: DC | PRN
  Administered 2011-08-21: 4 mg via INTRAVENOUS
  Filled 2011-08-20: qty 2

## 2011-08-20 MED ORDER — FLUTICASONE PROPIONATE 50 MCG/ACT NA SUSP
2.0000 | Freq: Every day | NASAL | Status: DC | PRN
  Filled 2011-08-20: qty 16

## 2011-08-20 MED ORDER — TOPIRAMATE 25 MG PO TABS
25.0000 mg | ORAL_TABLET | Freq: Every day | ORAL | Status: DC
  Administered 2011-08-20 – 2011-08-24 (×4): 25 mg via ORAL
  Filled 2011-08-20 (×6): qty 1

## 2011-08-20 MED ORDER — OXYCODONE HCL 5 MG PO TABS
10.0000 mg | ORAL_TABLET | ORAL | Status: DC | PRN
  Administered 2011-08-21 – 2011-08-25 (×19): 10 mg via ORAL
  Filled 2011-08-20 (×19): qty 2

## 2011-08-20 MED ORDER — LEVOTHYROXINE SODIUM 112 MCG PO TABS
112.0000 ug | ORAL_TABLET | Freq: Every day | ORAL | Status: DC
  Administered 2011-08-21 – 2011-08-25 (×4): 112 ug via ORAL
  Filled 2011-08-20 (×5): qty 1

## 2011-08-20 MED ORDER — HYDROCODONE-ACETAMINOPHEN 5-325 MG PO TABS
1.0000 | ORAL_TABLET | ORAL | Status: DC | PRN
  Administered 2011-08-20 (×2): 1 via ORAL
  Administered 2011-08-21 – 2011-08-25 (×3): 2 via ORAL
  Filled 2011-08-20 (×4): qty 2

## 2011-08-20 MED ORDER — ONDANSETRON HCL 4 MG/2ML IJ SOLN
4.0000 mg | Freq: Once | INTRAMUSCULAR | Status: AC
  Administered 2011-08-20: 4 mg via INTRAVENOUS

## 2011-08-20 MED ORDER — FLUTICASONE FUROATE 27.5 MCG/SPRAY NA SUSP: 2.0000 | Freq: Every day | NASAL | Status: DC | PRN

## 2011-08-20 MED ORDER — PRENATAL PLUS 27-1 MG PO TABS
1.0000 | ORAL_TABLET | Freq: Every day | ORAL | Status: DC
  Administered 2011-08-20 – 2011-08-25 (×5): 1 via ORAL
  Filled 2011-08-20 (×6): qty 1

## 2011-08-20 MED ORDER — CALCIUM CARBONATE-VITAMIN D 500-200 MG-UNIT PO TABS
1.0000 | ORAL_TABLET | Freq: Every day | ORAL | Status: DC
  Administered 2011-08-20 – 2011-08-25 (×6): 1 via ORAL
  Filled 2011-08-20 (×6): qty 1

## 2011-08-20 MED ORDER — HYDROMORPHONE HCL PF 1 MG/ML IJ SOLN
1.0000 mg | Freq: Once | INTRAMUSCULAR | Status: AC
  Administered 2011-08-20: 1 mg via INTRAVENOUS
  Filled 2011-08-20: qty 1

## 2011-08-20 MED ORDER — HYDROMORPHONE HCL PF 1 MG/ML IJ SOLN: 1.0000 mg | INTRAMUSCULAR | Status: DC | PRN

## 2011-08-20 MED ORDER — FOLIC ACID 0.5 MG HALF TAB
400.0000 ug | ORAL_TABLET | Freq: Every day | ORAL | Status: DC
  Administered 2011-08-20 – 2011-08-25 (×5): 0.5 mg via ORAL
  Filled 2011-08-20 (×6): qty 1

## 2011-08-20 MED ORDER — DIPHENHYDRAMINE HCL 50 MG/ML IJ SOLN: 25.0000 mg | Freq: Four times a day (QID) | INTRAMUSCULAR | Status: DC | PRN

## 2011-08-20 MED ORDER — FLUTICASONE PROPIONATE 50 MCG/ACT NA SUSP
1.0000 | Freq: Every day | NASAL | Status: DC | PRN
  Filled 2011-08-20: qty 16

## 2011-08-20 MED ORDER — MORPHINE SULFATE 2 MG/ML IJ SOLN
2.0000 mg | INTRAMUSCULAR | Status: DC | PRN
  Administered 2011-08-22 – 2011-08-23 (×6): 2 mg via INTRAVENOUS
  Filled 2011-08-20 (×6): qty 1

## 2011-08-20 MED ORDER — CALCIUM CITRATE-VITAMIN D 200-200 MG-UNIT PO TABS: 1.0000 | ORAL_TABLET | Freq: Every day | ORAL | Status: DC

## 2011-08-20 MED ORDER — ONDANSETRON HCL 4 MG/2ML IJ SOLN
4.0000 mg | Freq: Once | INTRAMUSCULAR | Status: AC
  Administered 2011-08-20: 4 mg via INTRAVENOUS
  Filled 2011-08-20: qty 2

## 2011-08-20 MED ORDER — HYDROXYCHLOROQUINE SULFATE 200 MG PO TABS
200.0000 mg | ORAL_TABLET | Freq: Two times a day (BID) | ORAL | Status: DC
  Administered 2011-08-20 – 2011-08-25 (×8): 200 mg via ORAL
  Filled 2011-08-20 (×11): qty 1

## 2011-08-20 MED ORDER — ONDANSETRON HCL 4 MG/2ML IJ SOLN
INTRAMUSCULAR | Status: AC
  Filled 2011-08-20: qty 2

## 2011-08-20 MED ORDER — SODIUM CHLORIDE 0.9 % IV SOLN
INTRAVENOUS | Status: DC
  Administered 2011-08-23: 20 mL/h via INTRAVENOUS

## 2011-08-20 MED ORDER — VITAMIN D3 25 MCG (1000 UNIT) PO TABS
1000.0000 [IU] | ORAL_TABLET | Freq: Two times a day (BID) | ORAL | Status: DC
  Administered 2011-08-20 – 2011-08-25 (×8): 1000 [IU] via ORAL
  Filled 2011-08-20 (×12): qty 1

## 2011-08-20 MED ORDER — METHOCARBAMOL 500 MG PO TABS
500.0000 mg | ORAL_TABLET | Freq: Four times a day (QID) | ORAL | Status: DC | PRN
  Administered 2011-08-20 – 2011-08-23 (×8): 500 mg via ORAL
  Filled 2011-08-20 (×8): qty 1

## 2011-08-20 MED ORDER — DULOXETINE HCL 60 MG PO CPEP
60.0000 mg | ORAL_CAPSULE | Freq: Every day | ORAL | Status: DC
  Administered 2011-08-21 – 2011-08-25 (×4): 60 mg via ORAL
  Filled 2011-08-20 (×6): qty 1

## 2011-08-20 MED ORDER — DOCUSATE SODIUM 100 MG PO CAPS
100.0000 mg | ORAL_CAPSULE | Freq: Every day | ORAL | Status: DC
  Administered 2011-08-20 – 2011-08-24 (×4): 100 mg via ORAL

## 2011-08-20 MED ORDER — TRAMADOL HCL 50 MG PO TABS
50.0000 mg | ORAL_TABLET | Freq: Four times a day (QID) | ORAL | Status: DC | PRN
  Administered 2011-08-24: 50 mg via ORAL
  Filled 2011-08-20: qty 1

## 2011-08-20 MED ORDER — SODIUM CHLORIDE 0.9 % IV SOLN
Freq: Once | INTRAVENOUS | Status: AC
  Administered 2011-08-20: 14:00:00 via INTRAVENOUS

## 2011-08-20 NOTE — Progress Notes (Signed)
Patient ID: [name], female   DOB: 01/23/1946, 66 y.o.   MRN: [id number] No DVT medications for now due to severe bleeding risk.  She will have surgery Tuesday and is coming off of rheum meds.  This type of fracture will have bleeding and her subsequent surgery will have bleeding as well.

## 2011-08-20 NOTE — H&P (Signed)
[name] is an 66 y.o. female.   Chief Complaint:   Left arm severe pain post fall with known complex humerus fracture HPI:   66 yo female sustained an accidental mechanical fall while out to lunch today for Mother's Day.  Denies LOC.  Reports severe left arm pain.  Brought to [facility] ER and found to have a humerus shaft fracture.  Ortho consulted.  Past Medical History  Diagnosis Date  . Osteoporosis   . Rheumatoid arthritis   . Hypothyroid   . DDD (degenerative disc disease)     bone spurs in spine also  . Depression     Past Surgical History  Procedure Date  . Tonsillectomy   . Wisdom tooth extraction   . Abdominal hysterectomy     also removed left ovary    History reviewed. No pertinent family history. Social History:  reports that she quit smoking about 20 years ago. Her smoking use included Cigarettes. She smoked 2 packs per day. She does not have any smokeless tobacco history on file. She reports that she drinks alcohol. She reports that she does not use illicit drugs.  Allergies:  Allergies  Allergen Reactions  . Sulfa Antibiotics Nausea Only     (Not in a hospital admission)  Results for orders placed during the hospital encounter of 08/20/11 (from the past 48 hour(s))  COMPREHENSIVE METABOLIC PANEL     Status: Abnormal   Collection Time   08/20/11 12:55 PM      Component Value Range Comment   Sodium 131 (*) 135 - 145 (mEq/L)    Potassium 3.4 (*) 3.5 - 5.1 (mEq/L)    Chloride 97  96 - 112 (mEq/L)    CO2 25  19 - 32 (mEq/L)    Glucose, Bld 144 (*) 70 - 99 (mg/dL)    BUN 9  6 - 23 (mg/dL)    Creatinine, Ser 1.61  0.50 - 1.10 (mg/dL)    Calcium 8.1 (*) 8.4 - 10.5 (mg/dL)    Total Protein 6.2  6.0 - 8.3 (g/dL)    Albumin 3.6  3.5 - 5.2 (g/dL)    AST 25  0 - 37 (U/L)    ALT 20  0 - 35 (U/L)    Alkaline Phosphatase 43  39 - 117 (U/L)    Total Bilirubin 0.3  0.3 - 1.2 (mg/dL)    GFR calc non Af Amer >90  >90 (mL/min)    GFR calc Af Amer >90  >90  (mL/min)   CBC     Status: Abnormal   Collection Time   08/20/11 12:55 PM      Component Value Range Comment   WBC 6.8  4.0 - 10.5 (K/uL)    RBC 3.82 (*) 3.87 - 5.11 (MIL/uL)    Hemoglobin 12.6  12.0 - 15.0 (g/dL)    HCT 09.6  04.5 - 40.9 (%)    MCV 94.2  78.0 - 100.0 (fL)    MCH 33.0  26.0 - 34.0 (pg)    MCHC 35.0  30.0 - 36.0 (g/dL)    RDW 81.1  91.4 - 78.2 (%)    Platelets 192  150 - 400 (K/uL)   DIFFERENTIAL     Status: Normal   Collection Time   08/20/11 12:55 PM      Component Value Range Comment   Neutrophils Relative 74  43 - 77 (%)    Neutro Abs 5.0  1.7 - 7.7 (K/uL)    Lymphocytes Relative  18  12 - 46 (%)    Lymphs Abs 1.2  0.7 - 4.0 (K/uL)    Monocytes Relative 8  3 - 12 (%)    Monocytes Absolute 0.5  0.1 - 1.0 (K/uL)    Eosinophils Relative 0  0 - 5 (%)    Eosinophils Absolute 0.0  0.0 - 0.7 (K/uL)    Basophils Relative 0  0 - 1 (%)    Basophils Absolute 0.0  0.0 - 0.1 (K/uL)    Dg Hip Complete Right  08/20/2011  *RADIOLOGY REPORT*  Clinical Data: Fall, hip pain  RIGHT HIP - COMPLETE 2+ VIEW  Comparison: None.  Findings: Three views of the right hip submitted.  No acute fracture or subluxation.  Old fractures of the right superior and inferior pubic ramus at the level of pubic symphysis.  IMPRESSION: No acute fracture or subluxation.  Old fracture of the right superior and inferior pubic ramus.  Original Report Authenticated By: Natasha Mead, M.D.   Dg Shoulder Left  08/20/2011  *RADIOLOGY REPORT*  Clinical Data: Pain post fall  LEFT SHOULDER - 2+ VIEW  Comparison: None.  Findings: Two views of the left shoulder submitted.  There is displaced comminuted spiral fracture of the left humeral shaft.  IMPRESSION: Displaced comminuted spiral fracture of the left humerus.  Original Report Authenticated By: Natasha Mead, M.D.   Dg Humerus Left  08/20/2011  *RADIOLOGY REPORT*  Clinical Data: Fall  LEFT HUMERUS - 2+ VIEW  Comparison: Left shoulder same day  Findings: Two views of the  left humerus submitted.  Again noted displaced comminuted spiral fracture of the left humerus.  IMPRESSION: Again noted displaced comminuted spiral fracture of the left humerus.  Original Report Authenticated By: Natasha Mead, M.D.    Review of Systems  All other systems reviewed and are negative.    Blood pressure 141/50, pulse 82, temperature 98 F (36.7 C), temperature source Oral, resp. rate 17, SpO2 94.00%. Physical Exam  Constitutional: She is oriented to person, place, and time. She appears well-developed and well-nourished.  HENT:  Head: Normocephalic and atraumatic.  Eyes: EOM are normal. Pupils are equal, round, and reactive to light.  Neck: Normal range of motion. Neck supple.  Cardiovascular: Normal rate and regular rhythm.   Respiratory: Effort normal and breath sounds normal.  GI: Soft. Bowel sounds are normal.  Musculoskeletal:       Left upper arm: She exhibits bony tenderness, swelling, edema and deformity.  Neurological: She is alert and oriented to person, place, and time.  Skin: Skin is warm and dry.  Psychiatric: She has a normal mood and affect.    Left arm shows a deformity with the skin intact and she has a well-perfused hand with normal motor and sensory function   Assessment/Plan 1)Left complex humeral shaft fracture Given her age, RA and OA as well as the complex nature of this fracture, she will likely need open reduction/internal fixation.  She has severe pain, so we will place her left arm in a temporary coaptation splint and admit her for pain and nausea control as well as PT/OT.  We will hold on surgery today for several reasons being #1 this is not an emergency, #2 she last ate only 3 hours ago, #3 the OR has several cases already to go and is only running 1 room on Sunday, #4 I will need to have special plates ordered that are not on campus here at the moment and will need to be shipped in.  We will plan on surgery most likely late this Tuesday  evening. Kathryne Hitch 08/20/2011, 2:33 PM

## 2011-08-20 NOTE — ED Notes (Signed)
Went in to do EKG and patient family member did not want it done until her  Pain medication was giving and patient spoke with MD about it.  Went back to do EKG and patient was gone with x-ray

## 2011-08-20 NOTE — ED Notes (Signed)
Pt unable to use bedpan, taken off bedpan and will try again in a little while.  NAD noted at this time.

## 2011-08-20 NOTE — ED Provider Notes (Signed)
History     CSN: [id number]  Arrival date & time 08/20/11  1202   First MD Initiated Contact with Patient 08/20/11 1206      Chief Complaint  Patient presents with  . Fall  . Shoulder Injury    (Consider location/radiation/quality/duration/timing/severity/associated sxs/prior treatment) HPI 66 yo female with history of RA and osteoporosis brought in via EMS after fall.  States she was at [employer] today and was going to bathroom when she tripped over threshold of door.  Hit L shoulder and R hip on fall.  Unable to move L shoulder after fall.  Denies loss of consciousness or prodromal symptoms preceding fall.  She did fall recently in kitchen as well due to loss of balance.  She denies any chest pain, shortness of breath, dizziness.   Past Medical History  Diagnosis Date  . Osteoporosis   . Rheumatoid arthritis   . Hypothyroid   . DDD (degenerative disc disease)     bone spurs in spine also  . Depression     Past Surgical History  Procedure Date  . Tonsillectomy   . Wisdom tooth extraction   . Abdominal hysterectomy     also removed left ovary    History reviewed. No pertinent family history.  History  Substance Use Topics  . Smoking status: Former Smoker -- 2.0 packs/day    Types: Cigarettes    Quit date: 05/15/1991  . Smokeless tobacco: Not on file  . Alcohol Use: Yes     sober for 30 years  history of alcoholism    OB History    Grav Para Term Preterm Abortions TAB SAB Ect Mult Living                  Review of Systems  Constitutional: Negative for fever, chills and fatigue.  HENT: Negative for trouble swallowing, neck pain and neck stiffness.   Eyes: Negative for visual disturbance.  Respiratory: Negative for cough, chest tightness and shortness of breath.   Cardiovascular: Negative for chest pain.  Gastrointestinal: Positive for nausea (After receiving fentanyl) and vomiting. Negative for abdominal pain and abdominal distention.  Genitourinary:  Negative for dysuria, frequency and difficulty urinating.  Musculoskeletal: Positive for arthralgias (hx ra,).  Neurological: Negative for dizziness, speech difficulty, weakness, light-headedness and headaches.    Allergies  Sulfa antibiotics  Home Medications   Current Outpatient Rx  Name Route Sig Dispense Refill  . ASPIRIN 81 MG PO TABS Oral Take 160 mg by mouth daily.    Marland Kitchen CALCIUM CITRATE-VITAMIN D 200-200 MG-UNIT PO TABS Oral Take 1 tablet by mouth daily.    Marland Kitchen VITAMIN D 1000 UNITS PO TABS Oral Take 1,000 Units by mouth 2 (two) times daily.    Marland Kitchen DOCUSATE SODIUM 100 MG PO CAPS Oral Take 100 mg by mouth Nightly.    . DULOXETINE HCL 60 MG PO CPEP Oral Take 60 mg by mouth daily.    Marland Kitchen ETANERCEPT 50 MG/ML [place] SOLN Subcutaneous Inject 50 mg into the skin once a week.    Marland Kitchen FLUTICASONE FUROATE 27.5 MCG/SPRAY NA SUSP Nasal Place 2 sprays into the nose daily as needed.    Marland Kitchen FOLIC ACID 400 MCG PO TABS Oral Take 400 mcg by mouth daily.    Marland Kitchen HYDROXYCHLOROQUINE SULFATE 200 MG PO TABS Oral Take 200 mg by mouth 2 (two) times daily.    Marland Kitchen LEVOTHYROXINE SODIUM 112 MCG PO TABS Oral Take 112 mcg by mouth daily.    Marland Kitchen LIDOCAINE 5 %  EX PTCH Transdermal Place 1 patch onto the skin 2 (two) times daily as needed. Remove & Discard patch within 12 hours or as directed by MD    . METHOTREXATE (ANTI-RHEUMATIC) 2.5 MG PO TABS Oral Take 7.5 mg by mouth 3 (three) times a week. 6 tablets once weekly    . PRENATAL PLUS 27-1 MG PO TABS Oral Take 1 tablet by mouth daily.    . TOPIRAMATE 25 MG PO TABS Oral Take 25 mg by mouth at bedtime.    . TRAMADOL HCL 50 MG PO TABS Oral Take 50 mg by mouth every 6 (six) hours as needed.      BP 121/79  Pulse 82  Temp(Src) 98 F (36.7 C) (Axillary)  Resp 18  SpO2 100%  Physical Exam  Constitutional: She is oriented to person, place, and time. She appears well-developed and well-nourished. No distress.  HENT:  Head: Normocephalic and atraumatic.  Mouth/Throat: Oropharynx is  clear and moist.  Eyes: EOM are normal. Pupils are equal, round, and reactive to light.  Neck: Neck supple.  Cardiovascular: Normal rate, regular rhythm and normal heart sounds.   Pulmonary/Chest: Breath sounds normal. No respiratory distress. She has no wheezes.  Abdominal: Soft. Bowel sounds are normal. She exhibits no distension. There is no tenderness.  Musculoskeletal:       L shoulder/upper arm, with large deformity, tender to palpation.  Unable to move, supported by sling.  NVI, no swelling distally.  Grip 5/5  No tenderness of R shoulder  R Hip with tenderness, she is able to move R leg but with pain and more difficulty than usual  L Hip non tender.  Spine non-tender.  Neurological: She is alert and oriented to person, place, and time. No cranial nerve deficit or sensory deficit.    ED Course  Procedures (including critical care time)   Labs Reviewed  COMPREHENSIVE METABOLIC PANEL  CBC  DIFFERENTIAL   No results found.   No diagnosis found.    MDM  ?dislocation vs humeral fracture given large deformity and swelling, will get Xray to assess for fracture.  Will also x-ray R hip given increased pain.  Dilaudid for pain control.  12:39 PM  Xray with displaced comminuted L humeral fracture.  Dr. Radford Pax spoke with Dr. Lanny Cramp who will come to evaluate patient.  2:14 PM        Everrett Coombe, DO 08/20/11 1440  Everrett Coombe, DO 08/21/11 1944

## 2011-08-20 NOTE — ED Notes (Signed)
Patient transported to X-ray 

## 2011-08-20 NOTE — ED Notes (Addendum)
Per EMS, pt fell in the bathroom at a restaurant and hit her left shoulder on the bathroom sink.  Pt with significant swelling and deformity to left shoulder.  Pt denies hitting head or any other injuries.  Pt denies loss of consciousness. Pt given Fentanyl per EMS.

## 2011-08-20 NOTE — ED Notes (Signed)
[id number]<AV> Expected date:<BR> Expected time:12:00 PM<BR> Means of arrival:Ambulance<BR> Comments:<BR> M40 -- Fall/Shoulder Injury

## 2011-08-20 NOTE — ED Notes (Signed)
Attempt x1 to call report to 6E. Requested to call back in 10 minutes due to RN being in another pt room.

## 2011-08-21 NOTE — Anesthesia Preprocedure Evaluation (Addendum)
Anesthesia Evaluation  Patient identified by MRN, date of birth, ID band Patient awake    Reviewed: Allergy & Precautions, H&P , NPO status , Patient's Chart, lab work & pertinent test results  Airway Mallampati: II TM Distance: >3 FB Neck ROM: full    Dental  (+) Dental Advisory Given and Edentulous Upper   Pulmonary neg pulmonary ROS,  breath sounds clear to auscultation  Pulmonary exam normal       Cardiovascular Exercise Tolerance: Good negative cardio ROS  Rhythm:regular Rate:Normal     Neuro/Psych negative neurological ROS  negative psych ROS   GI/Hepatic negative GI ROS, Neg liver ROS,   Endo/Other  negative endocrine ROSHypothyroidism   Renal/GU negative Renal ROS  negative genitourinary   Musculoskeletal  (+) Arthritis -, Rheumatoid disorders,    Abdominal   Peds  Hematology negative hematology ROS (+)   Anesthesia Other Findings   Reproductive/Obstetrics negative OB ROS                          Anesthesia Physical Anesthesia Plan  ASA: II  Anesthesia Plan: General   Post-op Pain Management:    Induction: Intravenous  Airway Management Planned: Oral ETT  Additional Equipment:   Intra-op Plan:   Post-operative Plan: Extubation in OR  Informed Consent: I have reviewed the patients History and Physical, chart, labs and discussed the procedure including the risks, benefits and alternatives for the proposed anesthesia with the patient or authorized representative who has indicated his/her understanding and acceptance.   Dental Advisory Given  Plan Discussed with: CRNA and Surgeon  Anesthesia Plan Comments:         Anesthesia Quick Evaluation

## 2011-08-21 NOTE — Progress Notes (Signed)
UR completed 

## 2011-08-21 NOTE — Progress Notes (Signed)
Patient ID: [name], female   DOB: December 21, 1945, 66 y.o.   MRN: [id number] Comfortable as possible given severe left humerus fracture.  In splint currently.  Understands the need for surgery and the timing for late tomorrow.  Will have therapy work with the patient today on mobility and ADL's.  No DVT meds due to bleeding risk and upcoming surgery.

## 2011-08-21 NOTE — Evaluation (Signed)
Occupational Therapy Evaluation Patient Details Name: [name] MRN: [id number] DOB: November 05, 1945 Today's Date: 08/21/2011  OT Assessment / Plan / Recommendation Clinical Impression  Pt will benefit from OT to increase I with ADL activity s/p surgery for L humeral fracture    OT Assessment  Patient needs continued OT Services    Follow Up Recommendations  Skilled nursing facility;Home health OT;Other (comment) (depending on progress after surgery)       Equipment Recommendations  Other (comment) (TBD)       Frequency  Min 2X/week    Precautions / Restrictions Precautions Precautions: Shoulder Type of Shoulder Precautions: Pt in sling awaiting surgery next day Required Braces or Orthoses: Other Brace/Splint Restrictions Weight Bearing Restrictions: Yes LUE Weight Bearing: Non weight bearing Other Position/Activity Restrictions: Pt will have shoulder surgery 5/14.        ADL  Eating/Feeding: Performed;Set up Where Assessed - Eating/Feeding: Edge of bed Grooming: Simulated;Brushing hair Where Assessed - Grooming: Unsupported sitting Upper Body Bathing: Simulated;Maximal assistance Where Assessed - Upper Body Bathing: Unsupported;Sitting, bed Lower Body Bathing: Simulated;Maximal assistance Where Assessed - Lower Body Bathing: Sitting, bed;Unsupported Upper Body Dressing: Maximal assistance;Simulated Where Assessed - Upper Body Dressing: Sitting, bed;Unsupported Lower Body Dressing: Simulated;Maximal assistance Where Assessed - Lower Body Dressing: Sitting, bed;Unsupported Toilet Transfer: Not assessed Toilet Transfer Method: Not assessed Toileting - Clothing Manipulation: Not assessed Toileting - Hygiene: Not assessed Where Assessed - Toileting Hygiene: Not assessed Tub/Shower Transfer: Not assessed Tub/Shower Transfer Method: Not assessed ADL Comments: Pt refused to stand even with much encouragement. Pt fearful of falling and wants to wait until after surgery to  get OOB. Pt did sit EOB for approx 15 minutes    OT Diagnosis: Generalized weakness;Acute pain  OT Problem List: Decreased strength;Decreased range of motion;Decreased activity tolerance;Decreased knowledge of precautions;Impaired UE functional use;Pain;Increased edema;Other (comment) (edema in hand) OT Treatment Interventions: Self-care/ADL training;Therapeutic exercise;Other (comment) (exercise as MD indicates post surgery)   OT Goals Acute Rehab OT Goals OT Goal Formulation: With patient ADL Goals Pt Will Perform Upper Body Dressing: with min assist;Unsupported;Sitting, chair ADL Goal: Upper Body Dressing - Progress: Goal set today Pt Will Transfer to Toilet: with min assist;Stand pivot transfer;3-in-1 ADL Goal: Toilet Transfer - Progress: Goal set today        Prior Functioning  Home Living Lives With: Spouse Available Help at Discharge: Family Type of Home: House Home Access: Stairs to enter Secretary/administrator of Steps: 1 Entrance Stairs-Rails: None Home Layout: One level Bathroom Shower/Tub: Health visitor: Handicapped height Bathroom Accessibility: Yes Home Adaptive Equipment: Walker - rolling;Bedside commode/3-in-1 Prior Function Level of Independence: Independent;Needs assistance (pt reports several falls recently ) Communication Communication: No difficulties    Cognition  Overall Cognitive Status: Appears within functional limits for tasks assessed/performed Arousal/Alertness: Awake/alert Orientation Level: Appears intact for tasks assessed Behavior During Session: Anxious    Extremity/Trunk Assessment Right Upper Extremity Assessment RUE ROM/Strength/Tone: [facility] for tasks assessed Left Upper Extremity Assessment LUE ROM/Strength/Tone: Unable to fully assess;Due to precautions   Mobility Bed Mobility Bed Mobility: Supine to Sit Supine to Sit: 3: Mod assist Details for Bed Mobility Assistance: Upon sitting up initially pt dizzy and returned  to supine, then agreed to sit up again. BP 130/72 Transfers Transfers: Not assessed   Exercise Hand Exercises Wrist Flexion: AROM;5 reps;Seated Wrist Extension: Other (comment) (left) Digit Composite Flexion: Other (comment) (left) Composite Extension: AROM;5 reps;Seated     End of Session OT - End of Session Activity Tolerance: Other (  comment) Patient left: in bed Nurse Communication: Mobility status   Alba Cory 08/21/2011, 2:03 PM

## 2011-08-21 NOTE — Progress Notes (Signed)
Physical Therapy Evaluation  08/21/11 1611  PT Visit Information  Last PT Received On 08/21/11  Assistance Needed +2  PT Time Calculation  PT Start Time 1527  PT Stop Time 1540  PT Time Calculation (min) 13 min  Subjective Data  Subjective The patient reports she has recent history of increased falls for "no reason".  She doesn't black out or get dizzy she just collapses for no reason per patient.    Patient Stated Goal to stop doing so much so she won't fall. I informed her that may have the opposite effect on her falls.    Precautions  Precautions Shoulder  Type of Shoulder Precautions Pt in sling awaiting surgery 08/22/11  Required Braces or Orthoses Other Brace/Splint  Other Brace/Splint L shoulder splinted and in sling.    Restrictions  LUE Weight Bearing NWB  Home Living  Lives With Spouse  Available Help at Discharge Family  Type of Home House  Home Access Stairs to enter  Entrance Stairs-Number of Steps 1  Entrance Stairs-Rails None  Home Layout One level  Bathroom Shower/Tub Walk-in shower  Bathroom Toilet Handicapped height  Home Adaptive Equipment Walker - rolling;Bedside commode/3-in-1  Prior Function  Level of Independence Independent;Needs assistance (reports history of several falls recently)  Communication  Communication No difficulties  Cognition  Overall Cognitive Status Appears within functional limits for tasks assessed/performed  Arousal/Alertness Awake/alert  Orientation Level Appears intact for tasks assessed  Behavior During Session Anxious  Cognition - Other Comments not specifically tested  Right Lower Extremity Assessment  RLE ROM/Strength/Tone Deficits  RLE ROM/Strength/Tone Deficits grossly 3/5 per functional assessment.    Left Lower Extremity Assessment  LLE ROM/Strength/Tone Deficits  LLE ROM/Strength/Tone Deficits grossly 3/5 per functional assessment.    Bed Mobility  Supine to Sit 1: +2 Total assist;HOB elevated  Supine to Sit:  Patient Percentage 60%  Sitting - Scoot to Edge of Bed 1: +2 Total assist  Sitting - Scoot to Edge of Bed: Patient Percentage 60%  Details for Bed Mobility Assistance Support of trunk to get to upring sitting and to weight shift hips to square up on EOB  Transfers  Transfers Sit to Stand;Stand to Sit  Sit to Stand 1: +2 Total assist;From elevated surface;With upper extremity assist;From bed  Sit to Stand: Patient Percentage 60%  Stand to Sit 1: +2 Total assist;To elevated surface;To bed  Stand to Sit: Patient Percentage 60%  Details for Transfer Assistance Patient is limited mostly by her anxiety.  She also became nauseated upon standing.  She needed R hand held assist knees and feet blocked on both sides to get to standing where she felt safe.  She adamately refused to try OOB to chair.  Only stood ~1 minute.    Ambulation/Gait  Ambulation/Gait Assistance (refused)  Static Sitting Balance  Static Sitting - Balance Support Right upper extremity supported;Feet supported  Static Sitting - Level of Assistance 4: Min assist  Static Sitting - Comment/# of Minutes min assist to prevent posterior LOB   PT - End of Session  Equipment Utilized During Treatment (L shoulder sling adjusted to support her better )  Activity Tolerance Patient limited by fatigue;Patient limited by pain (limited by anxiety)  Patient left in bed;with call bell/phone within reach  PT Assessment  Clinical Impression Statement 66 y.o. female admitted to [facility] with fall sustaining a L shoulder displaced humerus fracture.  The fracture is currently splinted and the patient has a sling.  She is due to go to  surgery for repair of the fracture on 08-22-11 in the PM.  MD wanted her to stay mobilie while awaiting surgery.  She presents with increased fear and anxiety re: mobility especially walking or OOB for fear of falling again.  She also has decreased leg strength, balance, increased pain and decreased activity tolerance and gait  ability.  She would benefit from acute PT to maximize her independence, functional mobility and safety so that she may be able to return home after extensive rehab.    PT Recommendation/Assessment Patient needs continued PT services  PT Problem List Decreased strength;Decreased activity tolerance;Decreased balance;Decreased mobility;Decreased knowledge of use of DME;Pain  PT Therapy Diagnosis  Difficulty walking;Abnormality of gait;Generalized weakness;Acute pain  PT Plan  PT Frequency Min 3X/week  PT Treatment/Interventions DME instruction;Gait training;Functional mobility training;Therapeutic activities;Therapeutic exercise;Balance training;Neuromuscular re-education;Patient/family education  PT Recommendation  Follow Up Recommendations Skilled nursing facility  Equipment Recommended Defer to next venue  Individuals Consulted  Consulted and Agree with Results and Recommendations Patient  Acute Rehab PT Goals  PT Goal Formulation With patient  Time For Goal Achievement 09/04/11  Potential to Achieve Goals Good  Pt will go Supine/Side to Sit with min assist;with HOB 0 degrees  PT Goal: Supine/Side to Sit - Progress Goal set today  Pt will go Sit to Supine/Side with min assist;with HOB 0 degrees  PT Goal: Sit to Supine/Side - Progress Goal set today  Pt will go Sit to Stand with min assist  PT Goal: Sit to Stand - Progress Goal set today  Pt will go Stand to Sit with min assist  PT Goal: Stand to Sit - Progress Goal set today  Pt will Transfer Bed to Chair/Chair to Bed with min assist  PT Transfer Goal: Bed to Chair/Chair to Bed - Progress Goal set today  Pt will Ambulate 16 - 50 feet;with min assist;with least restrictive assistive device  PT Goal: Ambulate - Progress Goal set today  Written Expression  Dominant Hand Left    [name], PT, DPT [phone]

## 2011-08-22 ENCOUNTER — Inpatient Hospital Stay (HOSPITAL_COMMUNITY): Payer: Medicare Other | Admitting: Anesthesiology

## 2011-08-22 ENCOUNTER — Encounter (HOSPITAL_COMMUNITY): Payer: Self-pay | Admitting: Anesthesiology

## 2011-08-22 ENCOUNTER — Inpatient Hospital Stay (HOSPITAL_COMMUNITY): Payer: Medicare Other

## 2011-08-22 ENCOUNTER — Encounter (HOSPITAL_COMMUNITY)
Admission: EM | Disposition: A | Discharge status: Skilled Nursing Facility | Payer: Self-pay | Source: Home / Self Care | Attending: Orthopaedic Surgery

## 2011-08-22 HISTORY — PX: ORIF HUMERUS FRACTURE: SHX2126

## 2011-08-22 LAB — BASIC METABOLIC PANEL
CO2: 28 mEq/L (ref 19–32)
Chloride: 97 mEq/L (ref 96–112)
Glucose, Bld: 111 mg/dL — ABNORMAL HIGH (ref 70–99)
Potassium: 3.8 mEq/L (ref 3.5–5.1)
Sodium: 132 mEq/L — ABNORMAL LOW (ref 135–145)

## 2011-08-22 LAB — SURGICAL PCR SCREEN: Staphylococcus aureus: POSITIVE — AB

## 2011-08-22 LAB — CBC
Hemoglobin: 9.4 g/dL — ABNORMAL LOW (ref 12.0–15.0)
RBC: 2.9 MIL/uL — ABNORMAL LOW (ref 3.87–5.11)

## 2011-08-22 SURGERY — OPEN REDUCTION INTERNAL FIXATION (ORIF) HUMERAL SHAFT FRACTURE
Anesthesia: General | Site: Arm Upper | Laterality: Left | Wound class: Clean

## 2011-08-22 MED ORDER — ACETAMINOPHEN 10 MG/ML IV SOLN
1000.0000 mg | Freq: Once | INTRAVENOUS | Status: AC
  Administered 2011-08-22: 1000 mg via INTRAVENOUS
  Filled 2011-08-22: qty 100

## 2011-08-22 MED ORDER — MIDAZOLAM HCL 5 MG/5ML IJ SOLN
INTRAMUSCULAR | Status: DC | PRN
  Administered 2011-08-22: 1 mg via INTRAVENOUS

## 2011-08-22 MED ORDER — PROMETHAZINE HCL 25 MG/ML IJ SOLN: 6.2500 mg | INTRAMUSCULAR | Status: DC | PRN

## 2011-08-22 MED ORDER — BUPIVACAINE-EPINEPHRINE 0.5% -1:200000 IJ SOLN
INTRAMUSCULAR | Status: DC | PRN
  Administered 2011-08-22: 10 mL

## 2011-08-22 MED ORDER — ACETAMINOPHEN 10 MG/ML IV SOLN
INTRAVENOUS | Status: AC
  Filled 2011-08-22: qty 100

## 2011-08-22 MED ORDER — 0.9 % SODIUM CHLORIDE (POUR BTL) OPTIME
TOPICAL | Status: DC | PRN
  Administered 2011-08-22: 1000 mL

## 2011-08-22 MED ORDER — HYDROMORPHONE HCL PF 1 MG/ML IJ SOLN
INTRAMUSCULAR | Status: DC | PRN
  Administered 2011-08-22 (×2): 1 mg via INTRAVENOUS

## 2011-08-22 MED ORDER — LIDOCAINE HCL (CARDIAC) 20 MG/ML IV SOLN
INTRAVENOUS | Status: DC | PRN
  Administered 2011-08-22: 50 mg via INTRAVENOUS

## 2011-08-22 MED ORDER — PROPOFOL 10 MG/ML IV BOLUS
INTRAVENOUS | Status: DC | PRN
  Administered 2011-08-22: 50 mg via INTRAVENOUS

## 2011-08-22 MED ORDER — CEFAZOLIN SODIUM 1-5 GM-% IV SOLN
INTRAVENOUS | Status: DC | PRN
  Administered 2011-08-22: 1 g via INTRAVENOUS

## 2011-08-22 MED ORDER — HYDROMORPHONE HCL PF 1 MG/ML IJ SOLN: 0.2500 mg | INTRAMUSCULAR | Status: DC | PRN

## 2011-08-22 MED ORDER — LACTATED RINGERS IV SOLN
INTRAVENOUS | Status: DC | PRN
  Administered 2011-08-22: 17:00:00 via INTRAVENOUS

## 2011-08-22 MED ORDER — PHENYLEPHRINE HCL 10 MG/ML IJ SOLN
INTRAMUSCULAR | Status: DC | PRN
  Administered 2011-08-22: 40 ug via INTRAVENOUS

## 2011-08-22 MED ORDER — FENTANYL CITRATE 0.05 MG/ML IJ SOLN
INTRAMUSCULAR | Status: DC | PRN
  Administered 2011-08-22 (×10): 25 ug via INTRAVENOUS

## 2011-08-22 MED ORDER — DEXAMETHASONE SODIUM PHOSPHATE 10 MG/ML IJ SOLN
INTRAMUSCULAR | Status: DC | PRN
  Administered 2011-08-22: 10 mg via INTRAVENOUS

## 2011-08-22 MED ORDER — CEFAZOLIN SODIUM 1-5 GM-% IV SOLN
1.0000 g | Freq: Three times a day (TID) | INTRAVENOUS | Status: AC
  Administered 2011-08-23 (×3): 1 g via INTRAVENOUS
  Filled 2011-08-22 (×3): qty 50

## 2011-08-22 SURGICAL SUPPLY — 60 items
BAG SPEC THK2 15X12 ZIP CLS (MISCELLANEOUS) ×1
BAG ZIPLOCK 12X15 (MISCELLANEOUS) ×2 IMPLANT
BANDAGE ELASTIC 6 VELCRO ST LF (GAUZE/BANDAGES/DRESSINGS) ×1 IMPLANT
BIT DRILL QC 2.7 6.3IN  SHORT (BIT) ×2
BIT DRILL QC 2.7 6.3IN SHORT (BIT) IMPLANT
CLEANER TIP ELECTROSURG 2X2 (MISCELLANEOUS) ×1 IMPLANT
CLOTH BEACON ORANGE TIMEOUT ST (SAFETY) ×2 IMPLANT
CUFF TOURN SGL QUICK 18 (TOURNIQUET CUFF) ×1 IMPLANT
DRAPE C-ARM 42X72 X-RAY (DRAPES) ×1 IMPLANT
DRAPE U-SHAPE 47X51 STRL (DRAPES) ×2 IMPLANT
DRSG EMULSION OIL 3X3 NADH (GAUZE/BANDAGES/DRESSINGS) ×1 IMPLANT
DRSG PAD ABDOMINAL 8X10 ST (GAUZE/BANDAGES/DRESSINGS) ×3 IMPLANT
DURAPREP 26ML APPLICATOR (WOUND CARE) ×2 IMPLANT
ELECT REM PT RETURN 9FT ADLT (ELECTROSURGICAL) ×2
ELECTRODE REM PT RTRN 9FT ADLT (ELECTROSURGICAL) ×1 IMPLANT
GAUZE XEROFORM 5X9 LF (GAUZE/BANDAGES/DRESSINGS) ×1 IMPLANT
GLOVE BIO SURGEON STRL SZ7.5 (GLOVE) ×3 IMPLANT
GLOVE BIOGEL PI IND STRL 8 (GLOVE) IMPLANT
GLOVE BIOGEL PI INDICATOR 8 (GLOVE) ×1
GOWN STRL NON-REIN LRG LVL3 (GOWN DISPOSABLE) ×3 IMPLANT
GOWN STRL REIN XL XLG (GOWN DISPOSABLE) ×2 IMPLANT
NEEDLE HYPO 22GX1.5 SAFETY (NEEDLE) ×1 IMPLANT
NS IRRIG 1000ML POUR BTL (IV SOLUTION) ×2 IMPLANT
PACK LOWER EXTREMITY WL (CUSTOM PROCEDURE TRAY) ×2 IMPLANT
PACK SHOULDER CUSTOM OPM052 (CUSTOM PROCEDURE TRAY) ×2 IMPLANT
PAD CAST 4YDX4 CTTN HI CHSV (CAST SUPPLIES) ×1 IMPLANT
PADDING CAST COTTON 4X4 STRL (CAST SUPPLIES)
PADDING CAST COTTON 6X4 STRL (CAST SUPPLIES) ×1 IMPLANT
PLATE LOCK 12 HOLE (Plate) ×1 IMPLANT
POSITIONER SURGICAL ARM (MISCELLANEOUS) ×2 IMPLANT
SCREW 3.5X24MM (Screw) ×3 IMPLANT
SCREW 3.5X28MM (Screw) ×1 IMPLANT
SCREW 4.0X26 (Screw) ×2 IMPLANT
SCREW 4.0X28 (Screw) ×1 IMPLANT
SCREW CORTICAL 3.5X22 (Screw) ×3 IMPLANT
SCREW LCK T20 CUT FLUT 28X3.5X (Screw) IMPLANT
SCREW LOCK 3.5X20 (Screw) ×1 IMPLANT
SCREW LOCK 3.5X22MM (Screw) ×1 IMPLANT
SCREW LOCK 3.5X28 (Screw) ×2 IMPLANT
SCREW LOCK 3.5X30 (Screw) ×1 IMPLANT
SCREW NL 3.5X18 (Screw) ×1 IMPLANT
SCREW NL 3.5X26 (Screw) ×1 IMPLANT
SCREW NONLOCK 3.5X36 (Screw) ×1 IMPLANT
SLING ARM FOAM STRAP LRG (SOFTGOODS) ×2 IMPLANT
SLING ARM FOAM STRAP MED (SOFTGOODS) ×1 IMPLANT
SPONGE GAUZE 4X4 12PLY (GAUZE/BANDAGES/DRESSINGS) ×2 IMPLANT
SPONGE LAP 18X18 X RAY DECT (DISPOSABLE) ×2 IMPLANT
STAPLER VISISTAT 35W (STAPLE) ×1 IMPLANT
STRIP CLOSURE SKIN 1/2X4 (GAUZE/BANDAGES/DRESSINGS) ×1 IMPLANT
SUT ETHILON 3 0 PS 1 (SUTURE) IMPLANT
SUT MNCRL AB 4-0 PS2 18 (SUTURE) ×1 IMPLANT
SUT VIC AB 0 CT1 27 (SUTURE) ×4
SUT VIC AB 0 CT1 27XBRD ANTBC (SUTURE) IMPLANT
SUT VIC AB 1 CT1 27 (SUTURE)
SUT VIC AB 1 CT1 27XBRD ANTBC (SUTURE) ×2 IMPLANT
SUT VIC AB 2-0 CT1 27 (SUTURE) ×4
SUT VIC AB 2-0 CT1 TAPERPNT 27 (SUTURE) ×2 IMPLANT
SYR CONTROL 10ML LL (SYRINGE) ×1 IMPLANT
TOWEL OR 17X26 10 PK STRL BLUE (TOWEL DISPOSABLE) ×6 IMPLANT
WATER STERILE IRR 1500ML POUR (IV SOLUTION) ×1 IMPLANT

## 2011-08-22 NOTE — Transfer of Care (Signed)
Immediate Anesthesia Transfer of Care Note  Patient: [name]  Procedure(s) Performed: Procedure(s) (LRB): OPEN REDUCTION INTERNAL FIXATION (ORIF) HUMERAL SHAFT FRACTURE (Left)  Patient Location: PACU  Anesthesia Type: General  Level of Consciousness: awake, alert , oriented and patient cooperative  Airway & Oxygen Therapy: Patient Spontanous Breathing and Patient connected to face mask oxygen  Post-op Assessment: Report given to PACU RN, Post -op Vital signs reviewed and stable and Patient moving all extremities X 4  Post vital signs: Reviewed and stable  Complications: No apparent anesthesia complications

## 2011-08-22 NOTE — Progress Notes (Signed)
Clinical Social Work Department BRIEF PSYCHOSOCIAL ASSESSMENT 08/22/2011  Patient:  [name], [name]     Account Number:  [id number]     Admit date:  08/20/2011  Clinical Social Worker:  Candie Chroman  Date/Time:  08/22/2011 09:09 AM  Referred by:  Physician  Date Referred:  08/22/2011 Referred for  SNF Placement   Other Referral:   Interview type:  Patient Other interview type:    PSYCHOSOCIAL DATA Living Status:  HUSBAND Admitted from facility:   Level of care:   Primary support name:   Primary support relationship to patient:   Degree of support available:   supportive    CURRENT CONCERNS Current Concerns  Post-Acute Placement   Other Concerns:    SOCIAL WORK ASSESSMENT / PLAN Pt is a 66 yr old female living at home prior to hospitalization. Met with pt 5/13 to assist with d/c planning. Pt is interested in ST SNF placement . She is scheduled for surgery tonite. CSW will initiate SNF search and provide bed offers as received.   Assessment/plan status:  Psychosocial Support/Ongoing Assessment of Needs Other assessment/ plan:   Information/referral to community resources:    PATIENT'S/FAMILY'S RESPONSE TO PLAN OF CARE: Pt is interested in SNF placement following hospitalization.     Cori Razor  LCSW  [phone]

## 2011-08-22 NOTE — Progress Notes (Signed)
Patient ID: [name], female   DOB: [birth year], 66 y.o.   MRN: [id number] She is comfortable this am.  I explained in length what surgery this evening will involve as well as the risks and benefits.  Informed consent is obtained.  We will proceed with ORIF of her left humerus fracture late today.

## 2011-08-22 NOTE — Brief Op Note (Signed)
08/20/2011 - 08/22/2011  8:25 PM  PATIENT:  [name]  66 y.o. female  PRE-OPERATIVE DIAGNOSIS:  Left humerus shaft fracture  POST-OPERATIVE DIAGNOSIS:  Left humerus shaft fracture  PROCEDURE:  Procedure(s) (LRB): OPEN REDUCTION INTERNAL FIXATION (ORIF) HUMERAL SHAFT FRACTURE (Left)  SURGEON:  Surgeon(s) and Role:    * Kathryne Hitch, MD - Primary  PHYSICIAN ASSISTANT:   ASSISTANTS: none   ANESTHESIA:   general  EBL:  Total I/O In: -  Out: 300 [Blood:300]  BLOOD ADMINISTERED:none  DRAINS: none   LOCAL MEDICATIONS USED:  MARCAINE     SPECIMEN:  No Specimen  DISPOSITION OF SPECIMEN:  N/A  COUNTS:  YES  TOURNIQUET:  * No tourniquets in log *  DICTATION: .Other Dictation: Dictation Number [id number]  PLAN OF CARE: Admit to inpatient   PATIENT DISPOSITION:  PACU - hemodynamically stable.   Delay start of Pharmacological VTE agent (>24hrs) due to surgical blood loss or risk of bleeding: not applicable

## 2011-08-22 NOTE — Anesthesia Postprocedure Evaluation (Signed)
  Anesthesia Post-op Note  Patient: [name]  Procedure(s) Performed: Procedure(s) (LRB): OPEN REDUCTION INTERNAL FIXATION (ORIF) HUMERAL SHAFT FRACTURE (Left)  Patient Location: PACU  Anesthesia Type: General  Level of Consciousness: awake and alert   Airway and Oxygen Therapy: Patient Spontanous Breathing  Post-op Pain: mild  Post-op Assessment: Post-op Vital signs reviewed, Patient's Cardiovascular Status Stable, Respiratory Function Stable, Patent Airway and No signs of Nausea or vomiting  Post-op Vital Signs: stable  Complications: No apparent anesthesia complications

## 2011-08-23 LAB — CBC
Platelets: 184 10*3/uL (ref 150–400)
RBC: 2.46 MIL/uL — ABNORMAL LOW (ref 3.87–5.11)
RDW: 13 % (ref 11.5–15.5)
WBC: 13.4 10*3/uL — ABNORMAL HIGH (ref 4.0–10.5)

## 2011-08-23 LAB — BASIC METABOLIC PANEL
CO2: 27 mEq/L (ref 19–32)
Chloride: 96 mEq/L (ref 96–112)
Creatinine, Ser: 0.57 mg/dL (ref 0.50–1.10)
GFR calc Af Amer: >90 mL/min (ref >90)
Potassium: 3.6 mEq/L (ref 3.5–5.1)
Sodium: 131 mEq/L — ABNORMAL LOW (ref 135–145)

## 2011-08-23 MED ORDER — ASPIRIN EC 325 MG PO TBEC
325.0000 mg | DELAYED_RELEASE_TABLET | Freq: Every day | ORAL | Status: DC
  Administered 2011-08-23 – 2011-08-25 (×3): 325 mg via ORAL
  Filled 2011-08-23 (×3): qty 1

## 2011-08-23 MED FILL — Bacitracin Intramuscular For Soln 50000 Unit: INTRAMUSCULAR | Qty: 1 | Status: AC

## 2011-08-23 MED FILL — Sodium Chloride Irrigation Soln 0.9%: Qty: 1000 | Status: AC

## 2011-08-23 MED FILL — Sodium Chloride Irrigation Soln 0.9%: Qty: 500 | Status: AC

## 2011-08-23 MED FILL — Polymyxin B Sulfate For Inj 500000 Unit: INTRAMUSCULAR | Qty: 1 | Status: AC

## 2011-08-23 NOTE — Progress Notes (Signed)
Subjective: 1 Day Post-Op Procedure(s) (LRB): OPEN REDUCTION INTERNAL FIXATION (ORIF) HUMERAL SHAFT FRACTURE (Left) Patient reports pain as moderate to severe.  Objective: Vital signs in last 24 hours: Temp:  [97.4 F (36.3 C)-100.8 F (38.2 C)] 98.9 F (37.2 C) (05/15 0507) Pulse Rate:  [60-124] 105  (05/15 0507) Resp:  [10-16] 14  (05/15 0507) BP: (92-137)/(60-84) 127/75 mmHg (05/15 0507) SpO2:  [98 %-100 %] 100 % (05/15 0507)  Intake/Output from previous day: 05/14 0701 - 05/15 0700 In: 1960 [P.O.:720; I.V.:1040; IV Piggyback:200] Out: 1350 [Urine:1050; Blood:300] Intake/Output this shift: Total I/O In: 1720 [P.O.:480; I.V.:1040; IV Piggyback:200] Out: 750 [Urine:450; Blood:300]   Basename 08/22/11 0457 08/20/11 1454 08/20/11 1255  HGB 9.4* 12.2 12.6    Basename 08/22/11 0457 08/20/11 1454 08/20/11 1255  WBC 6.9 -- 6.8  RBC 2.90* -- 3.82*  HCT 27.7* 36.0 --  PLT 167 -- 192    Basename 08/22/11 0457 08/20/11 1454 08/20/11 1255  NA 132* 130* --  K 3.8 5.0 --  CL 97 104 --  CO2 28 -- 25  BUN 6 9 --  CREATININE 0.61 0.50 --  GLUCOSE 111* 146* --  CALCIUM 9.0 -- 8.1*   No results found for this basename: LABPT:2,INR:2 in the last 72 hours  Neurovascular intact Sensation intact distally Intact pulses distally Incision: dressing C/D/I Compartment soft  Assessment/Plan: 1 Day Post-Op Procedure(s) (LRB): OPEN REDUCTION INTERNAL FIXATION (ORIF) HUMERAL SHAFT FRACTURE (Left) Up with therapy. No weight thru left arm at all. Will need short-term SNF placement.  [name] Y 08/23/2011, 6:32 AM

## 2011-08-23 NOTE — Evaluation (Addendum)
Physical Therapy Re-Evaluation Patient Details Name: [name] MRN: [id number] DOB: 10-01-1945 Today's Date: 08/23/2011 Time: 4098-1191 PT Time Calculation (min): 44 min  PT Assessment / Plan / Recommendation Clinical Impression  Pt presents with L shoulder displaced humeral fracture s/p ORIF POD 1 with decreased mobility, strength, and balance.  Continues to be VERY anxious with moving, however pt did agree to transfer from the bed to the 3in1.  Continues to have decreased leg strength, balance, increased pain and decreased activity tolerance and gait ability and will benefit from skilled PT in acute venue to address deficits.  PT recommends SNF for follow up therapy at D/C in order for pt to safely return home.      PT Assessment  Patient needs continued PT services    Follow Up Recommendations  Skilled nursing facility    Barriers to Discharge        lEquipment Recommendations  Defer to next venue    Recommendations for Other Services     Frequency Min 3X/week    Precautions / Restrictions Precautions Precautions: Fall;Shoulder Type of Shoulder Precautions: NWB, sling L UE Required Braces or Orthoses: Other Brace/Splint Other Brace/Splint: L shoulder splinted Restrictions Weight Bearing Restrictions: Yes LUE Weight Bearing: Non weight bearing   Pertinent Vitals/Pain 6/10 pain, then no pain with sling adjustment.       Mobility  Bed Mobility Bed Mobility: Supine to Sit;Sitting - Scoot to [place] of Bed;Sit to Supine;Scooting to [facility] Supine to Sit: 1: +2 Total assist Supine to Sit: Patient Percentage: 40% Sitting - Scoot to Edge of Bed: 1: +2 Total assist Sitting - Scoot to Edge of Bed: Patient Percentage: 60% Sit to Supine: 1: +2 Total assist Sit to Supine: Patient Percentage: 80% Scooting to [facility]: 5: Supervision Details for Bed Mobility Assistance: Requires some assist for LEs off of bed with assist for trunk and cues for R hand placement to assist trunk into  sitting.   Transfers Transfers: Sit to Stand;Stand to Sit Sit to Stand: 1: +2 Total assist;With upper extremity assist;With armrests;From bed;From chair/3-in-1 Sit to Stand: Patient Percentage: 40% Stand to Sit: 1: +2 Total assist Stand to Sit: Patient Percentage: 60% Details for Transfer Assistance: Pt continues to become VERY anxious when standing.  Provided assist for upright stance, including blocking R knee with cues for R hand placement and for breathing to assist with anxiety.  Pt also vomited once sitting on 3in1.   Ambulation/Gait Ambulation/Gait Assistance: 1: +2 Total assist Ambulation/Gait: Patient Percentage: 30% Ambulation Distance (Feet): 5 Feet Assistive device: Other (Comment);1 person hand held assist (assisted pt under R shoulder and at L hip) Ambulation/Gait Assistance Details: Took some steps from bed to 3in1.  Cues as above.  Gait Pattern: Step-to pattern;Decreased stride length Gait velocity: decreased Stairs: No Wheelchair Mobility Wheelchair Mobility: No    Exercises     PT Diagnosis: Difficulty walking;Abnormality of gait;Generalized weakness;Acute pain  PT Problem List: Decreased strength;Decreased range of motion;Decreased activity tolerance;Decreased balance;Decreased mobility;Decreased coordination;Decreased knowledge of use of DME;Pain PT Treatment Interventions: DME instruction;Gait training;Functional mobility training;Therapeutic activities;Therapeutic exercise;Balance training;Patient/family education   PT Goals Acute Rehab PT Goals PT Goal Formulation: With patient Time For Goal Achievement: 09/04/11 Potential to Achieve Goals: Good Pt will go Supine/Side to Sit: with min assist PT Goal: Supine/Side to Sit - Progress: Goal set today Pt will go Sit to Supine/Side: with supervision PT Goal: Sit to Supine/Side - Progress: Goal set today Pt will go Sit to Stand: with min assist PT  Goal: Sit to Stand - Progress: Goal set today Pt will go Stand to  Sit: with min assist PT Goal: Stand to Sit - Progress: Goal set today Pt will Transfer Bed to Chair/Chair to Bed: with min assist PT Transfer Goal: Bed to Chair/Chair to Bed - Progress: Goal set today Pt will Ambulate: 16 - 50 feet;with min assist;with least restrictive assistive device PT Goal: Ambulate - Progress: Goal set today  Visit Information  Last PT Received On: 08/23/11 Assistance Needed: +2 PT/OT Co-Evaluation/Treatment: Yes    Subjective Data  Subjective: I'm so nauseous Patient Stated Goal: To figure out why I'm falling so much.    Prior Functioning  Home Living Lives With: Spouse Available Help at Discharge: Family Type of Home: House Home Access: Stairs to enter Secretary/administrator of Steps: 1 Entrance Stairs-Rails: None Home Layout: One level Bathroom Shower/Tub: Health visitor: Handicapped height Home Adaptive Equipment: Walker - rolling;Bedside commode/3-in-1 Prior Function Level of Independence: Needs assistance (states she has had many falls recently) Communication Communication: No difficulties Dominant Hand: Left    Cognition  Overall Cognitive Status: Appears within functional limits for tasks assessed/performed Arousal/Alertness: Awake/alert Orientation Level: Appears intact for tasks assessed Behavior During Session: Anxious    Extremity/Trunk Assessment Right Lower Extremity Assessment RLE ROM/Strength/Tone Deficits: Grossly 3/5 per functional assessment.  RLE Sensation: WFL - Light Touch RLE Coordination: WFL - gross motor Left Lower Extremity Assessment LLE ROM/Strength/Tone Deficits: Grossly 3/5 per functional assessment.  LLE Sensation: WFL - Light Touch LLE Coordination: WFL - gross motor Trunk Assessment Trunk Assessment: Normal   Balance Static Sitting Balance Static Sitting - Balance Support: Right upper extremity supported;Feet supported Static Sitting - Level of Assistance: 7: Independent  End of Session PT -  End of Session Equipment Utilized During Treatment: Other (comment) (L sling) Activity Tolerance: Patient limited by fatigue;Patient limited by pain;Other (comment) (Limited by anxiety. ) Patient left: in bed;with call bell/phone within reach   Lessie Dings 08/23/2011, 3:35 PM

## 2011-08-23 NOTE — Progress Notes (Signed)
Patient ID: [name], female   DOB: 12-Feb-1946, 66 y.o.   MRN: [id number] No DVT medications due to bleeding risk post-op combined with her being a severe fall risk.

## 2011-08-23 NOTE — Progress Notes (Signed)
Occupational Therapy Treatment Patient Details Name: [name] MRN: [id number] DOB: [birth year] Today's Date: 08/23/2011 Time: 4098-1191 OT Time Calculation (min): 46 min  OT Assessment / Plan / Recommendation Comments on Treatment Session Pt with anxiety limiting mobility.  Had episode of N/V.    Goals added s/p surgery (ORIF L humerus).    Follow Up Recommendations  Skilled nursing facility    Barriers to Discharge       Equipment Recommendations  Defer to next venue    Recommendations for Other Services    Frequency Min 2X/week   Plan Discharge plan remains appropriate    Precautions / Restrictions Precautions Precautions: Fall;Shoulder Type of Shoulder Precautions: NWB, sling L UE Required Braces or Orthoses: Other Brace/Splint Other Brace/Splint: L shoulder splinted Restrictions Weight Bearing Restrictions: Yes LUE Weight Bearing: Non weight bearing   Pertinent Vitals/Pain 6/10 L shoulder    ADL  Upper Body Dressing: Other (comment);Performed (educated in hemitechniques and sling use) Where Assessed - Upper Body Dressing: Sitting, bed Toilet Transfer: Performed;+2 Total assistance;Comment for patient % (30) Toilet Transfer Method: Stand pivot Acupuncturist: Set designer - Hygiene: Performed;Set up Where Assessed - Toileting Hygiene: Sit on 3-in-1 or toilet Equipment Used: Other (comment) (sling) ADL Comments: Pt very self limiting and fearful of falling.  Required encouragement for OOB, but refused to remain up in chair.  Vomited x 1.    OT Diagnosis:    OT Problem List:   OT Treatment Interventions:     OT Goals Acute Rehab OT Goals Time For Goal Achievement: 09/06/11 Potential to Achieve Goals: Fair ADL Goals Pt Will Perform Upper Body Dressing: with min assist;Unsupported;Sitting, chair ADL Goal: Upper Body Dressing - Progress: Progressing toward goals Pt Will Transfer to Toilet: with min assist;Stand pivot  transfer;3-in-1 ADL Goal: Toilet Transfer - Progress: Not progressing Miscellaneous OT Goals Miscellaneous OT Goal #1: Pt will perform supine to sit with min assist in prep for ADL. OT Goal: Miscellaneous Goal #1 - Progress: Goal set today Miscellaneous OT Goal #2: Pt will donn UE sling with min assist with arm in correct position. OT Goal: Miscellaneous Goal #2 - Progress: Goal set today Miscellaneous OT Goal #3: Pt will verbalize and assist staff in positioning L UE in bed and chair. OT Goal: Miscellaneous Goal #3 - Progress: Goal set today  Visit Information  Last OT Received On: 08/23/11 Assistance Needed: +2    Subjective Data      Prior Functioning  Home Living Lives With: Spouse Available Help at Discharge: Family Type of Home: House Home Access: Stairs to enter Secretary/administrator of Steps: 1 Entrance Stairs-Rails: None Home Layout: One level Bathroom Shower/Tub: Health visitor: Handicapped height Home Adaptive Equipment: Walker - rolling;Bedside commode/3-in-1 Prior Function Level of Independence: Needs assistance (states she has had many falls recently) Communication Communication: No difficulties Dominant Hand: Left    Cognition  Overall Cognitive Status: Appears within functional limits for tasks assessed/performed Arousal/Alertness: Awake/alert Orientation Level: Appears intact for tasks assessed Behavior During Session: Anxious    Mobility Bed Mobility Bed Mobility: Supine to Sit;Sit to Supine Supine to Sit: 1: +2 Total assist;HOB elevated (40) Supine to Sit: Patient Percentage: 40% Sitting - Scoot to Edge of Bed: 2: Max assist Sit to Supine: 4: Min assist;HOB flat;With rail Transfers Sit to Stand: 1: +2 Total assist;From elevated surface;With upper extremity assist;From bed Sit to Stand: Patient Percentage: 60% Stand to Sit: 1: +2 Total assist;To elevated surface;To bed Stand to Sit:  Patient Percentage: 60%   Exercises    Balance  Static Sitting Balance Static Sitting - Balance Support: Right upper extremity supported;Feet supported Static Sitting - Level of Assistance: 7: Independent  End of Session OT - End of Session Activity Tolerance: Patient limited by pain;Other (comment) (nausea/vomiting/anxiety) Patient left: in bed;with call bell/phone within reach   Evern Bio 08/23/2011, 3:28 PM [phone]

## 2011-08-24 LAB — CBC
MCH: 33.6 pg (ref 26.0–34.0)
MCHC: 35.3 g/dL (ref 30.0–36.0)
Platelets: 212 10*3/uL (ref 150–400)
RDW: 13.2 % (ref 11.5–15.5)

## 2011-08-24 LAB — PREPARE RBC (CROSSMATCH)

## 2011-08-24 MED ORDER — DIPHENHYDRAMINE HCL 25 MG PO CAPS
25.0000 mg | ORAL_CAPSULE | Freq: Once | ORAL | Status: AC
  Administered 2011-08-24: 25 mg via ORAL
  Filled 2011-08-24: qty 1

## 2011-08-24 MED ORDER — FUROSEMIDE 10 MG/ML IJ SOLN
20.0000 mg | Freq: Once | INTRAMUSCULAR | Status: AC
  Administered 2011-08-24: 20 mg via INTRAVENOUS
  Filled 2011-08-24: qty 2

## 2011-08-24 MED ORDER — ACETAMINOPHEN 325 MG PO TABS
650.0000 mg | ORAL_TABLET | Freq: Once | ORAL | Status: AC
  Administered 2011-08-24: 650 mg via ORAL
  Filled 2011-08-24: qty 2

## 2011-08-24 NOTE — Progress Notes (Signed)
PT Cancellation Note  ___Treatment cancelled today due to medical issues with patient which prohibited therapy  _X_ Treatment cancelled today due to patient still receiving blood  ___ Treatment cancelled today due to patient's refusal to participate   ___ Treatment cancelled today due to  Felecia Shelling  PTA [facility]  Acute  Rehab Pager     [phone]

## 2011-08-24 NOTE — Progress Notes (Signed)
CSW assisting with d/c planning. Pt has ST SNF bed available Fri at [facility]. CSW will assist with d/c planning to SNF.  Cori Razor LCSW [phone]

## 2011-08-24 NOTE — Progress Notes (Signed)
Patient ID: [name], female   DOB: June 28, 1945, 66 y.o.   MRN: [id number] Hgb down to 7.2.  Will transfuse 2 units PRBCs.  Still plan on d/c tomorrow to ST-SNF.

## 2011-08-24 NOTE — Progress Notes (Signed)
Patient ID: [name], female   DOB: 01-12-1946, 66 y.o.   MRN: [id number] Hgb yesterday down to 8.0.  Certainly acute blood loss anemia.  Has not had much symptoms from this.  May require transfusion if becomes symptomatic.  Continue therapy otherwise.  The plan is to discharge tomorrow to 5/17.

## 2011-08-25 LAB — TYPE AND SCREEN
ABO/RH(D): A NEG
Antibody Screen: NEGATIVE
Unit division: 0

## 2011-08-25 MED ORDER — METHOCARBAMOL 500 MG PO TABS: 500.0000 mg | ORAL_TABLET | Freq: Four times a day (QID) | ORAL | Status: AC | PRN

## 2011-08-25 MED ORDER — OXYCODONE-ACETAMINOPHEN 5-325 MG PO TABS: 1.0000 | ORAL_TABLET | ORAL | Status: AC | PRN

## 2011-08-25 NOTE — Progress Notes (Signed)
Gave report to Sunday Spillers, RN at [facility]. Awaiting transport to take patient to facility.

## 2011-08-25 NOTE — Op Note (Signed)
NAMECLEOPHA, INDELICATO NO.:  [id number]  [name]  LOCATION:  [id number]                         FACILITY:  [facility]  PHYSICIAN:  Vanita Panda. Magnus Ivan, M.D.DATE OF BIRTH:  19-Nov-1945  DATE OF PROCEDURE:  08/22/2011 DATE OF DISCHARGE:                              OPERATIVE REPORT   PREOPERATIVE DIAGNOSIS:  Left segmental comminuted humeral shaft complex humeral shaft fracture.  POSTOPERATIVE DIAGNOSIS:  Left segmental comminuted humeral shaft complex humeral shaft fracture.  PROCEDURE:  Open reduction internal fixation of complex humeral shaft fracture.  IMPLANTS:  Smith and Nephew 12 hole 3.5 locking plate with all screw holes filled in the plate and 2 screws outside the plate.  SURGEON:  Vanita Panda. Magnus Ivan, M.D.  ANESTHESIA: 1. General. 2. Local with 0.50% Sensorcaine.  BLOOD LOSS:  300 mL.  COMPLICATIONS:  None.  INDICATIONS:  [name] is a 66 year old female with rheumatoid arthritis as well as osteoporosis.  She was at lunch with family for Mother's Day when she sustained a mechanical fall accidentally.  She was seen at the [facility] emergency room and found to have a segmental highly comminuted humeral shaft fracture.  She was in some amount of pain, I placed her in a coaptation splint and admitted her for definitive surgical treatment.  We are taking her to the operating room now to plate the humerus due to her high risk of not healing this given her osteoporosis and the segmental nature of the fracture.  The risks and benefits of the surgery have been explained to her in detail, and her family and they did wish to proceed with surgery.  DESCRIPTION OF PROCEDURE:  After informed consent was obtained, the appropriate left arm was marked.  She was brought to the operative room, placed supine on the operating table.  General anesthesia was then obtained.  Her left arm was then taken out of the splint and placed on the  radiolucent arm table.  Her left arm was prepped and draped from the axilla and the lateral left chest down to her wrist, a sterile stockinette was utilized as well.  A time-out was called and she was identified as the correct patient, correct left arm.  I then made an anterolateral approach to the humerus with a long incision.  I dissected down meticulously to an interval between the brachialis and the lateral soft tissues.  I did identify the radial nerve as it came from the spiral groove and going distally.  I found a highly comminuted fracture that ran from proximal to distal.  Using reduction forceps I then was able to piece together all of the segments to bring the fracture into alignment and reduce.  I then chose the longest Smith and Nephew plate available, also with a small frag set due to her arm being small.  I was able to use a 3.5 plate this and had this span the entire length of the humerus with a 12 hole plate.  I then passed screws through all the screw holes to hold the fracture as reduced as possible.  Proximally I put 2 additional cancellous screws to further try to secure the tenuous fracture.  I then took a few spot pictures with a mini C-arm, and was pleased with my reduction.  I copiously irrigated the soft tissue with normal saline solution and closed the deep tissue with 0 Vicryl followed by 2-0 Vicryl to the subcutaneous tissue, and staples on the skin. Xeroform followed by well-padded sterile dressing was applied.  The arm was placed back in a sling.  She was awakened, extubated and taken to recovery in stable condition.  All final counts were correct, there were no complications noted.     Vanita Panda. Magnus Ivan, M.D.     CYB/MEDQ  D:  08/22/2011  T:  08/25/2011  Job:  [id number]

## 2011-08-25 NOTE — Discharge Summary (Signed)
Patient ID: [name] MRN: [id number] DOB/AGE: 11/03/1945 66 y.o.  Admit date: 08/20/2011 Discharge date: 08/25/2011  Admission Diagnoses:  Principal Problem:  *Fracture, humerus, shaft   Discharge Diagnoses:  Same  Past Medical History  Diagnosis Date  . Osteoporosis   . Hypothyroid   . Depression   . Rheumatoid arthritis   . DDD (degenerative disc disease)     bone spurs in spine also    Surgeries: Procedure(s): OPEN REDUCTION INTERNAL FIXATION (ORIF) HUMERAL SHAFT FRACTURE on 08/20/2011 - 08/22/2011   Consultants:    Discharged Condition: Improved  Hospital Course: [name] is an 66 y.o. female who was admitted 08/20/2011 for operative treatment ofFracture, humerus, shaft. Patient has severe unremitting pain that affects sleep, daily activities, and work/hobbies. After pre-op clearance the patient was taken to the operating room on 08/20/2011 - 08/22/2011 and underwent  Procedure(s): OPEN REDUCTION INTERNAL FIXATION (ORIF) HUMERAL SHAFT FRACTURE.    Patient was given perioperative antibiotics: Anti-infectives     Start     Dose/Rate Route Frequency Ordered Stop   2011-09-18 0000   ceFAZolin (ANCEF) IVPB 1 g/50 mL premix        1 g 100 mL/hr over 30 Minutes Intravenous 3 times per day 08/22/11 2150 09-18-11 1512   08/20/11 2200   hydroxychloroquine (PLAQUENIL) tablet 200 mg        200 mg Oral 2 times daily 08/20/11 1621             Patient was given sequential compression devices, early ambulation, and chemoprophylaxis to prevent DVT.  Patient benefited maximally from hospital stay and there were no complications.    Recent vital signs: Patient Vitals for the past 24 hrs:  BP Temp Temp src Pulse Resp SpO2  08/25/11 0501 132/76 mmHg 98.2 F (36.8 C) Oral 69  16  94 %  08/24/11 2022 93/59 mmHg 98.5 F (36.9 C) Oral 85  16  94 %  08/24/11 1910 102/65 mmHg 98.4 F (36.9 C) Oral 96  16  -  08/24/11 1805 102/64 mmHg 98.1 F (36.7 C) Axillary 101  16  -    08/24/11 1705 91/58 mmHg 98.1 F (36.7 C) Oral 87  14  -  08/24/11 1621 108/68 mmHg 98.1 F (36.7 C) Oral 91  16  100 %  08/24/11 1515 106/72 mmHg 99.1 F (37.3 C) Oral 90  16  100 %  08/24/11 1414 103/65 mmHg 98.4 F (36.9 C) Oral 97  20  100 %  08/24/11 1315 100/64 mmHg 98.6 F (37 C) Oral 100  18  -  08/24/11 1215 101/66 mmHg 97.4 F (36.3 C) Oral 93  16  -  08/24/11 1150 100/58 mmHg 99.9 F (37.7 C) Axillary 98  16  -     Recent laboratory studies:  Basename 08/24/11 0747 09-18-11 0800  WBC 11.6* 13.4*  HGB 7.2* 8.0*  HCT 20.4* 23.4*  PLT 212 184  NA -- 131*  K -- 3.6  CL -- 96  CO2 -- 27  BUN -- 8  CREATININE -- 0.57  GLUCOSE -- 128*  INR -- --  CALCIUM -- 8.1*     Discharge Medications:   Medication List  As of 08/25/2011  7:19 AM   TAKE these medications         aspirin 81 MG tablet   Take 160 mg by mouth daily.      calcium citrate-vitamin D 200-200 MG-UNIT Tabs   Take 1 tablet by mouth daily.  cholecalciferol 1000 UNITS tablet   Commonly known as: VITAMIN D   Take 1,000 Units by mouth 2 (two) times daily.      docusate sodium 100 MG capsule   Commonly known as: COLACE   Take 100 mg by mouth at bedtime.      DULoxetine 60 MG capsule   Commonly known as: CYMBALTA   Take 60 mg by mouth daily.      etanercept 50 MG/ML injection   Commonly known as: ENBREL   Inject 50 mg into the skin once a week. On tuesdays.      fluticasone 27.5 MCG/SPRAY nasal spray   Commonly known as: VERAMYST   Place 2 sprays into the nose daily as needed. For stuffy nose      folic acid 400 MCG tablet   Commonly known as: FOLVITE   Take 400 mcg by mouth daily.      hydroxychloroquine 200 MG tablet   Commonly known as: PLAQUENIL   Take 200 mg by mouth 2 (two) times daily.      levothyroxine 112 MCG tablet   Commonly known as: SYNTHROID, LEVOTHROID   Take 112 mcg by mouth daily.      lidocaine 5 %   Commonly known as: LIDODERM   Place 1 patch onto the skin 2  (two) times daily as needed. Remove & Discard patch within 12 hours or as directed by MD      methocarbamol 500 MG tablet   Commonly known as: ROBAXIN   Take 1 tablet (500 mg total) by mouth every 6 (six) hours as needed.      methotrexate 2.5 MG tablet   Commonly known as: RHEUMATREX   Take 7.5 mg by mouth once a week. On Mondays. 6 tablets once weekly      oxyCODONE-acetaminophen 5-325 MG per tablet   Commonly known as: PERCOCET   Take 1-2 tablets by mouth every 4 (four) hours as needed for pain.      prenatal vitamin w/FE, FA 27-1 MG Tabs   Take 1 tablet by mouth daily.      topiramate 25 MG tablet   Commonly known as: TOPAMAX   Take 25 mg by mouth at bedtime.      traMADol 50 MG tablet   Commonly known as: ULTRAM   Take 50 mg by mouth every 6 (six) hours as needed. For pain            Diagnostic Studies: Dg Hip Complete Right  08/20/2011  *RADIOLOGY REPORT*  Clinical Data: Fall, hip pain  RIGHT HIP - COMPLETE 2+ VIEW  Comparison: None.  Findings: Three views of the right hip submitted.  No acute fracture or subluxation.  Old fractures of the right superior and inferior pubic ramus at the level of pubic symphysis.  IMPRESSION: No acute fracture or subluxation.  Old fracture of the right superior and inferior pubic ramus.  Original Report Authenticated By: Natasha Mead, M.D.   Dg Shoulder Left  08/20/2011  *RADIOLOGY REPORT*  Clinical Data: Pain post fall  LEFT SHOULDER - 2+ VIEW  Comparison: None.  Findings: Two views of the left shoulder submitted.  There is displaced comminuted spiral fracture of the left humeral shaft.  IMPRESSION: Displaced comminuted spiral fracture of the left humerus.  Original Report Authenticated By: Natasha Mead, M.D.   Dg Humerus Left  08/22/2011  *RADIOLOGY REPORT*  Clinical Data: Follow-up left humerus fracture.  Postop.  LEFT HUMERUS - 2+ VIEW  Comparison: 08/20/2011  Findings:  Fixation plate and screws are seen transfixing a comminuted humeral shaft  fracture in anatomic alignment.  Overlying soft tissue swelling and skin staples noted.  IMPRESSION: Internal fixation of comminuted humeral shaft fracture in anatomic alignment.  Original Report Authenticated By: Danae Orleans, M.D.   Dg Humerus Left  08/20/2011  *RADIOLOGY REPORT*  Clinical Data: Fall  LEFT HUMERUS - 2+ VIEW  Comparison: Left shoulder same day  Findings: Two views of the left humerus submitted.  Again noted displaced comminuted spiral fracture of the left humerus.  IMPRESSION: Again noted displaced comminuted spiral fracture of the left humerus.  Original Report Authenticated By: Natasha Mead, M.D.    Disposition: 03-Skilled Nursing Facility  Discharge Orders    Future Orders Please Complete By Expires   Diet - low sodium heart healthy      Call MD / Call 911      Comments:   If you experience chest pain or shortness of breath, CALL 911 and be transported to the hospital emergency room.  If you develope a fever above 101 F, pus (white drainage) or increased drainage or redness at the wound, or calf pain, call your surgeon's office.   Constipation Prevention      Comments:   Drink plenty of fluids.  Prune juice may be helpful.  You may use a stool softener, such as Colace (over the counter) 100 mg twice a day.  Use MiraLax (over the counter) for constipation as needed.   Increase activity slowly as tolerated      Weight Bearing as taught in Physical Therapy      Comments:   Use a walker or crutches as instructed.   Discharge instructions      Comments:   You may get your actual incision wet in the shower starting Sunday 5/19. Expect bruising and swelling. No weight-bearing thru your left arm.  No lifting with your left arm. You may move your left elbow and shoulder.  No abduction past 90 degrees. Follow-up at [facility] with Dr. Magnus Ivan in 10-12 days.   Discharge patient            Signed: Kathryne Hitch 08/25/2011, 7:19 AM

## 2011-08-28 NOTE — Progress Notes (Signed)
Clinical Social Work Department CLINICAL SOCIAL WORK PLACEMENT NOTE 08/28/2011  Patient:  [name], [name]  Account Number:  [id number] Admit date:  08/20/2011  Clinical Social Worker:  Cori Razor, LCSW  Date/time:  08/28/2011 12:00 M  Clinical Social Work is seeking post-discharge placement for this patient at the following level of care:   SKILLED NURSING   (*CSW will update this form in Epic as items are completed)   08/23/2011  Patient/family provided with [facility] Department of Clinical Social Work's list of facilities offering this level of care within the geographic area requested by the patient (or if unable, by the patient's family).  08/23/2011  Patient/family informed of their freedom to choose among providers that offer the needed level of care, that participate in Medicare, Medicaid or managed care program needed by the patient, have an available bed and are willing to accept the patient.    Patient/family informed of [facility]' ownership interest in [facility], as well as of the fact that they are under no obligation to receive care at this facility.  PASARR submitted to EDS on 08/23/2011 PASARR number received from EDS on 08/23/2011  FL2 transmitted to all facilities in geographic area requested by pt/family on  08/23/2011 FL2 transmitted to all facilities within larger geographic area on   Patient informed that his/her managed care company has contracts with or will negotiate with  certain facilities, including the following:     Patient/family informed of bed offers received:  08/24/2011 Patient chooses bed at [facility] Physician recommends and patient chooses bed at    Patient to be transferred to [facility] on  08/25/2011 Patient to be transferred to facility by   The following physician request were entered in Epic:   Additional Comments:  Cori Razor  LCSW   [phone]

## 2011-08-29 ENCOUNTER — Encounter (HOSPITAL_COMMUNITY): Payer: Self-pay | Admitting: Orthopaedic Surgery

## 2011-08-29 NOTE — ED Provider Notes (Signed)
I saw and evaluated the patient, reviewed the resident's note and I agree with the findings and plan.   .Face to face Exam:  General:  Awake HEENT:  Atraumatic Resp:  Normal effort Abd:  Nondistended Neuro:No focal weakness Lymph: No adenopathy   Nelia Shi, MD 08/29/11 [phone]

## 2012-05-31 ENCOUNTER — Other Ambulatory Visit (HOSPITAL_COMMUNITY): Payer: Self-pay | Admitting: Internal Medicine

## 2012-06-04 ENCOUNTER — Encounter (HOSPITAL_COMMUNITY): Payer: Self-pay

## 2012-06-04 ENCOUNTER — Ambulatory Visit (HOSPITAL_COMMUNITY)
Admission: RE | Admit: 2012-06-04 | Discharge: 2012-06-04 | Disposition: A | Discharge status: Home / Self Care | Payer: Medicare PPO | Source: Ambulatory Visit | Attending: Internal Medicine | Admitting: Internal Medicine

## 2012-06-04 DIAGNOSIS — M810 Age-related osteoporosis without current pathological fracture: Secondary | ICD-10-CM | POA: Insufficient documentation

## 2012-06-04 MED ORDER — SODIUM CHLORIDE 0.9 % IV SOLN
Freq: Once | INTRAVENOUS | Status: AC
  Administered 2012-06-04: 14:00:00 via INTRAVENOUS

## 2012-06-04 MED ORDER — ZOLEDRONIC ACID 5 MG/100ML IV SOLN
5.0000 mg | Freq: Once | INTRAVENOUS | Status: AC
  Administered 2012-06-04: 5 mg via INTRAVENOUS
  Filled 2012-06-04: qty 100

## 2012-11-07 ENCOUNTER — Ambulatory Visit: Payer: Medicare PPO

## 2012-11-07 ENCOUNTER — Ambulatory Visit: Payer: Medicare PPO | Attending: Internal Medicine | Admitting: Physical Therapy

## 2012-11-07 DIAGNOSIS — R269 Unspecified abnormalities of gait and mobility: Secondary | ICD-10-CM | POA: Insufficient documentation

## 2012-11-07 DIAGNOSIS — IMO0001 Reserved for inherently not codable concepts without codable children: Secondary | ICD-10-CM | POA: Insufficient documentation

## 2012-11-07 DIAGNOSIS — R5381 Other malaise: Secondary | ICD-10-CM | POA: Insufficient documentation

## 2012-11-07 DIAGNOSIS — Z9181 History of falling: Secondary | ICD-10-CM | POA: Insufficient documentation

## 2012-11-07 DIAGNOSIS — M6281 Muscle weakness (generalized): Secondary | ICD-10-CM | POA: Insufficient documentation

## 2012-11-14 ENCOUNTER — Ambulatory Visit: Payer: Medicare PPO | Attending: Internal Medicine | Admitting: Physical Therapy

## 2012-11-14 DIAGNOSIS — R269 Unspecified abnormalities of gait and mobility: Secondary | ICD-10-CM | POA: Insufficient documentation

## 2012-11-14 DIAGNOSIS — Z9181 History of falling: Secondary | ICD-10-CM | POA: Insufficient documentation

## 2012-11-14 DIAGNOSIS — R5381 Other malaise: Secondary | ICD-10-CM | POA: Insufficient documentation

## 2012-11-14 DIAGNOSIS — IMO0001 Reserved for inherently not codable concepts without codable children: Secondary | ICD-10-CM | POA: Insufficient documentation

## 2012-11-14 DIAGNOSIS — M6281 Muscle weakness (generalized): Secondary | ICD-10-CM | POA: Insufficient documentation

## 2012-11-20 ENCOUNTER — Ambulatory Visit: Payer: Medicare PPO | Admitting: Physical Therapy

## 2012-11-26 ENCOUNTER — Ambulatory Visit: Payer: Medicare PPO | Admitting: Physical Therapy

## 2012-12-03 ENCOUNTER — Ambulatory Visit: Payer: Medicare PPO | Admitting: Physical Therapy

## 2013-01-17 ENCOUNTER — Other Ambulatory Visit: Payer: Self-pay | Admitting: Internal Medicine

## 2013-01-17 DIAGNOSIS — M549 Dorsalgia, unspecified: Secondary | ICD-10-CM

## 2013-01-25 ENCOUNTER — Other Ambulatory Visit: Payer: Medicare PPO

## 2013-02-01 ENCOUNTER — Ambulatory Visit
Admission: RE | Admit: 2013-02-01 | Discharge: 2013-02-01 | Disposition: A | Discharge status: Home / Self Care | Payer: Medicare PPO | Source: Ambulatory Visit | Attending: Internal Medicine | Admitting: Internal Medicine

## 2013-02-01 DIAGNOSIS — M549 Dorsalgia, unspecified: Secondary | ICD-10-CM

## 2013-02-21 ENCOUNTER — Ambulatory Visit
Admission: RE | Admit: 2013-02-21 | Discharge: 2013-02-21 | Disposition: A | Discharge status: Home / Self Care | Payer: Medicare PPO | Source: Ambulatory Visit | Attending: Pain Medicine | Admitting: Pain Medicine

## 2013-02-21 ENCOUNTER — Other Ambulatory Visit: Payer: Self-pay | Admitting: Pain Medicine

## 2013-02-21 DIAGNOSIS — M25561 Pain in right knee: Secondary | ICD-10-CM

## 2013-02-26 ENCOUNTER — Other Ambulatory Visit: Payer: Self-pay | Admitting: Pain Medicine

## 2013-02-26 DIAGNOSIS — M542 Cervicalgia: Secondary | ICD-10-CM

## 2013-03-11 ENCOUNTER — Ambulatory Visit
Admission: RE | Admit: 2013-03-11 | Discharge: 2013-03-11 | Disposition: A | Discharge status: Home / Self Care | Payer: Medicare PPO | Source: Ambulatory Visit | Attending: Pain Medicine | Admitting: Pain Medicine

## 2013-03-11 ENCOUNTER — Other Ambulatory Visit: Payer: Self-pay | Admitting: Pain Medicine

## 2013-03-11 DIAGNOSIS — M25561 Pain in right knee: Secondary | ICD-10-CM

## 2013-03-11 DIAGNOSIS — M542 Cervicalgia: Secondary | ICD-10-CM

## 2013-03-15 ENCOUNTER — Ambulatory Visit
Admission: RE | Admit: 2013-03-15 | Discharge: 2013-03-15 | Disposition: A | Discharge status: Home / Self Care | Payer: Medicare PPO | Source: Ambulatory Visit | Attending: Pain Medicine | Admitting: Pain Medicine

## 2013-03-15 DIAGNOSIS — M25561 Pain in right knee: Secondary | ICD-10-CM

## 2013-07-20 ENCOUNTER — Emergency Department (HOSPITAL_COMMUNITY): Payer: Medicare PPO

## 2013-07-20 ENCOUNTER — Emergency Department (HOSPITAL_COMMUNITY)
Admission: EM | Admit: 2013-07-20 | Discharge: 2013-07-21 | Disposition: A | Discharge status: Home / Self Care | Payer: Medicare PPO | Attending: Emergency Medicine | Admitting: Emergency Medicine

## 2013-07-20 ENCOUNTER — Encounter (HOSPITAL_COMMUNITY): Payer: Self-pay | Admitting: Emergency Medicine

## 2013-07-20 DIAGNOSIS — S0990XA Unspecified injury of head, initial encounter: Secondary | ICD-10-CM | POA: Insufficient documentation

## 2013-07-20 DIAGNOSIS — Y9389 Activity, other specified: Secondary | ICD-10-CM | POA: Insufficient documentation

## 2013-07-20 DIAGNOSIS — S199XXA Unspecified injury of neck, initial encounter: Secondary | ICD-10-CM

## 2013-07-20 DIAGNOSIS — S0993XA Unspecified injury of face, initial encounter: Secondary | ICD-10-CM | POA: Insufficient documentation

## 2013-07-20 DIAGNOSIS — M069 Rheumatoid arthritis, unspecified: Secondary | ICD-10-CM | POA: Insufficient documentation

## 2013-07-20 DIAGNOSIS — S0180XA Unspecified open wound of other part of head, initial encounter: Secondary | ICD-10-CM | POA: Insufficient documentation

## 2013-07-20 DIAGNOSIS — Z87891 Personal history of nicotine dependence: Secondary | ICD-10-CM | POA: Insufficient documentation

## 2013-07-20 DIAGNOSIS — S0181XA Laceration without foreign body of other part of head, initial encounter: Secondary | ICD-10-CM

## 2013-07-20 DIAGNOSIS — Z79899 Other long term (current) drug therapy: Secondary | ICD-10-CM | POA: Insufficient documentation

## 2013-07-20 DIAGNOSIS — F329 Major depressive disorder, single episode, unspecified: Secondary | ICD-10-CM | POA: Insufficient documentation

## 2013-07-20 DIAGNOSIS — W1809XA Striking against other object with subsequent fall, initial encounter: Secondary | ICD-10-CM | POA: Insufficient documentation

## 2013-07-20 DIAGNOSIS — Y929 Unspecified place or not applicable: Secondary | ICD-10-CM | POA: Insufficient documentation

## 2013-07-20 DIAGNOSIS — F3289 Other specified depressive episodes: Secondary | ICD-10-CM | POA: Insufficient documentation

## 2013-07-20 DIAGNOSIS — S60221A Contusion of right hand, initial encounter: Secondary | ICD-10-CM

## 2013-07-20 DIAGNOSIS — E039 Hypothyroidism, unspecified: Secondary | ICD-10-CM | POA: Insufficient documentation

## 2013-07-20 DIAGNOSIS — S60229A Contusion of unspecified hand, initial encounter: Secondary | ICD-10-CM | POA: Insufficient documentation

## 2013-07-20 DIAGNOSIS — Z7982 Long term (current) use of aspirin: Secondary | ICD-10-CM | POA: Insufficient documentation

## 2013-07-20 MED ORDER — ACETAMINOPHEN 500 MG PO TABS
1000.0000 mg | ORAL_TABLET | Freq: Once | ORAL | Status: AC
  Administered 2013-07-21: 1000 mg via ORAL
  Filled 2013-07-20: qty 2

## 2013-07-20 NOTE — ED Notes (Signed)
Bed: QX45 Expected date:  Expected time:  Means of arrival:  Comments: EMS 67yo F fall, left eye lac

## 2013-07-20 NOTE — ED Notes (Signed)
Per EMS report: pt from home: pt was in the shower when she fell while getting out.  Pt landed on her face.  EMS noted bruising on left arm.  Pt hx of bone density issues causing her to be at risk for fall.  Pt also complains of back pain but that is chronic.  EMS notes a 2inch laceration above her left eye. Pt denies LOC.  Pt a/o x 4. Pt does not take blood thinners.

## 2013-07-21 NOTE — Discharge Instructions (Signed)
Facial Laceration ° A facial laceration is a cut on the face. These injuries can be painful and cause bleeding. Lacerations usually heal quickly, but they need special care to reduce scarring. °DIAGNOSIS  °Your health care provider will take a medical history, ask for details about how the injury occurred, and examine the wound to determine how deep the cut is. °TREATMENT  °Some facial lacerations may not require closure. Others may not be able to be closed because of an increased risk of infection. The risk of infection and the chance for successful closure will depend on various factors, including the amount of time since the injury occurred. °The wound may be cleaned to help prevent infection. If closure is appropriate, pain medicines may be given if needed. Your health care provider will use stitches (sutures), wound glue (adhesive), or skin adhesive strips to repair the laceration. These tools bring the skin edges together to allow for faster healing and a better cosmetic outcome. If needed, you may also be given a tetanus shot. °HOME CARE INSTRUCTIONS °· Only take over-the-counter or prescription medicines as directed by your health care provider. °· Follow your health care provider's instructions for wound care. These instructions will vary depending on the technique used for closing the wound. °For Sutures: °· Keep the wound clean and dry.   °· If you were given a bandage (dressing), you should change it at least once a day. Also change the dressing if it becomes wet or dirty, or as directed by your health care provider.   °· Wash the wound with soap and water 2 times a day. Rinse the wound off with water to remove all soap. Pat the wound dry with a clean towel.   °· After cleaning, apply a thin layer of the antibiotic ointment recommended by your health care provider. This will help prevent infection and keep the dressing from sticking.   °· You may shower as usual after the first 24 hours. Do not soak the  wound in water until the sutures are removed.   °· Get your sutures removed as directed by your health care provider. With facial lacerations, sutures should usually be taken out after 4 5 days to avoid stitch marks.   °· Wait a few days after your sutures are removed before applying any makeup. °For Skin Adhesive Strips: °· Keep the wound clean and dry.   °· Do not get the skin adhesive strips wet. You may bathe carefully, using caution to keep the wound dry.   °· If the wound gets wet, pat it dry with a clean towel.   °· Skin adhesive strips will fall off on their own. You may trim the strips as the wound heals. Do not remove skin adhesive strips that are still stuck to the wound. They will fall off in time.   °For Wound Adhesive: °· You may briefly wet your wound in the shower or bath. Do not soak or scrub the wound. Do not swim. Avoid periods of heavy sweating until the skin adhesive has fallen off on its own. After showering or bathing, gently pat the wound dry with a clean towel.   °· Do not apply liquid medicine, cream medicine, ointment medicine, or makeup to your wound while the skin adhesive is in place. This may loosen the film before your wound is healed.   °· If a dressing is placed over the wound, be careful not to apply tape directly over the skin adhesive. This may cause the adhesive to be pulled off before the wound is healed.   °·   Avoid prolonged exposure to sunlight or tanning lamps while the skin adhesive is in place. °· The skin adhesive will usually remain in place for 5 10 days, then naturally fall off the skin. Do not pick at the adhesive film.   °After Healing: °Once the wound has healed, cover the wound with sunscreen during the day for 1 full year. This can help minimize scarring. Exposure to ultraviolet light in the first year will darken the scar. It can take 1 2 years for the scar to lose its redness and to heal completely.  °SEEK IMMEDIATE MEDICAL CARE IF: °· You have redness, pain, or  swelling around the wound.   °· You see a yellowish-white fluid (pus) coming from the wound.   °· You have chills or a fever.   °MAKE SURE YOU: °· Understand these instructions. °· Will watch your condition. °· Will get help right away if you are not doing well or get worse. °Document Released: 05/04/2004 Document Revised: 01/15/2013 Document Reviewed: 11/07/2012 °Exit[employer]® Patient Information ©2014 [employer]. ° °

## 2013-07-21 NOTE — ED Provider Notes (Signed)
CSN: [id number]     Arrival date & time 07/20/13  2103 History   First MD Initiated Contact with Patient 07/20/13 2303     Chief Complaint  Patient presents with  . Fall      HPI Patient presents emergency apartment after falling shortly after getting out of the shower.  She fell forward and struck her face.  She reports left-sided forehead pain with laceration.  She also reports mild pain in her neck.  She reports pain to her right hand.  She denies hip pain.  No chest pain shortness of breath.  No abdominal pain.  No change in her mental status per family.  Patient reports that she slipped and fell and then this was mechanical in nature.  No syncope.  No preceding chest pain or palpitations.   Past Medical History  Diagnosis Date  . Osteoporosis   . Hypothyroid   . Depression   . Rheumatoid arthritis(714.0)   . DDD (degenerative disc disease)     bone spurs in spine also   Past Surgical History  Procedure Laterality Date  . Tonsillectomy    . Wisdom tooth extraction    . Abdominal hysterectomy      also removed left ovary  . Abdominal hysterectomy    . Orif humerus fracture  08/22/2011    Procedure: OPEN REDUCTION INTERNAL FIXATION (ORIF) HUMERAL SHAFT FRACTURE;  Surgeon: Kathryne Hitch, MD;  Location: [facility];  Service: Orthopedics;  Laterality: Left;  Open Reduction Internal Fixation Left Humeral Shaft Fracture   Family History  Problem Relation Age of Onset  . Heart attack Mother   . Cancer Father   . Heart attack Brother   . Cancer Other   . Heart attack Other    History  Substance Use Topics  . Smoking status: Former Smoker -- 2.00 packs/day    Types: Cigarettes    Quit date: 05/15/1991  . Smokeless tobacco: Never Used  . Alcohol Use: Yes     Comment: sober for 30 years  history of alcoholism   OB History   Grav Para Term Preterm Abortions TAB SAB Ect Mult Living                 Review of Systems  All other systems reviewed and are  negative.     Allergies  Sulfa antibiotics  Home Medications   Current Outpatient Rx  Name  Route  Sig  Dispense  Refill  . acetaminophen (TYLENOL) 500 MG tablet   Oral   Take 1,000 mg by mouth 2 (two) times daily.         Marland Kitchen aspirin 81 MG tablet   Oral   Take 81 mg by mouth daily.          . calcium citrate-vitamin D 200-200 MG-UNIT TABS   Oral   Take 1 tablet by mouth daily.         . Cyanocobalamin (VITAMIN B 12 PO)   Oral   Take 1 tablet by mouth daily.         . DULoxetine (CYMBALTA) 60 MG capsule   Oral   Take 60 mg by mouth daily.         Marland Kitchen etanercept (ENBREL) 50 MG/ML injection   Subcutaneous   Inject 50 mg into the skin once a week. On tuesdays.         . fluticasone (VERAMYST) 27.5 MCG/SPRAY nasal spray   Nasal   Place 2 sprays into the nose  daily as needed. For stuffy nose         . levothyroxine (SYNTHROID, LEVOTHROID) 100 MCG tablet   Oral   Take 100 mcg by mouth daily before breakfast.         . magnesium oxide (MAG-OX) 400 MG tablet   Oral   Take 400 mg by mouth daily.         . methotrexate (RHEUMATREX) 2.5 MG tablet   Oral   Take 7.5 mg by mouth 2 (two) times daily. On Mondays. 3 tablets at lunch and 3 tablets at dinner         . prenatal vitamin w/FE, FA (PRENATAL 1 + 1) 27-1 MG TABS   Oral   Take 1 tablet by mouth daily.          BP 141/65  Pulse 85  Temp(Src) 97.8 F (36.6 C) (Oral)  Resp 16  SpO2 97% Physical Exam  Nursing note and vitals reviewed. Constitutional: She is oriented to person, place, and time. She appears well-developed and well-nourished. No distress.  HENT:  Head: Normocephalic and atraumatic.  Laceration overlying her left eyebrow.  It is approximately 2 cm in length.  No active bleeding at this time.  Small bruising and hematoma associated.  Extraocular movements are intact  Eyes: EOM are normal.  Neck: Neck supple.  Mild cervical and paracervical tenderness.  C-spine immobilized in  cervical collar  Cardiovascular: Normal rate, regular rhythm and normal heart sounds.   Pulmonary/Chest: Effort normal and breath sounds normal.  Abdominal: Soft. She exhibits no distension. There is no tenderness.  Musculoskeletal: Normal range of motion.  Neurological: She is alert and oriented to person, place, and time.  Skin: Skin is warm and dry.  Psychiatric: She has a normal mood and affect. Judgment normal.    ED Course  Procedures (including critical care time)  LACERATION REPAIR Performed by: Lyanne Co Consent: Verbal consent obtained. Risks and benefits: risks, benefits and alternatives were discussed Patient identity confirmed: provided demographic data Time out performed prior to procedure Prepped and Draped in normal sterile fashion Wound explored Laceration Location: Left forehead Laceration Length: 2 cm No Foreign Bodies seen or palpated Anesthesia: local infiltration Local anesthetic: lidocaine 2 % with epinephrine Anesthetic total: 4 ml Irrigation method: syringe Amount of cleaning: standard Skin closure: 5-0 Prolene  Number of sutures or staples: 4  Technique: Running interlocked  Patient tolerance: Patient tolerated the procedure well with no immediate complications.   Labs Review Labs Reviewed - No data to display Imaging Review Ct Head Wo Contrast  07/21/2013   CLINICAL DATA:  FALL head injury; trauma. Seizure. pain  EXAM: CT HEAD WITHOUT CONTRAST  CT CERVICAL SPINE WITHOUT CONTRAST  TECHNIQUE: Multidetector CT imaging of the head and cervical spine was performed following the standard protocol without intravenous contrast. Multiplanar CT image reconstructions of the cervical spine were also generated.  COMPARISON:  MR C SPINE W/O CM dated 03/11/2013; MR HEAD W/O CM dated 04/25/2011  FINDINGS: CT HEAD FINDINGS  Severe disproportionate symmetric temporal lobe volume loss with ex vacuo dilatation of the temporal horns, similar to prior examination. No  intraparenchymal hemorrhage, mass effect nor midline shift. Patchy supratentorial white matter hypodensities are less than expected for patient's age and though non-specific suggest sequelae of chronic small vessel ischemic disease. No acute large vascular territory infarcts.  No abnormal extra-axial fluid collections. Basal cisterns are patent. Minimal calcific atherosclerosis of the carotid siphons.  Small left frontal scalp hematoma. No skull  fracture. Visualized paranasal sinuses and mastoid aircells are well-aerated. The included ocular globes and orbital contents are non-suspicious.  CT CERVICAL SPINE FINDINGS  Cervical vertebral bodies and posterior elements are intact and aligned with maintenance of cervical lordosis. Moderate C4-5 and C5-6 degenerative disc disease, relatively unchanged. C1-2 articulation maintained with mild arthropathy. No destructive bony lesions. A right paraspinal subcutaneous mass was 14 mm in craniocaudad dimension comment now 17 mm and could reflect a sebaceous cyst though recommend correlation with physical examination. Mild calcific atherosclerosis of the carotid siphons.  IMPRESSION: CT head: Small left frontal scalp hematoma without underlying skull fracture and no acute intracranial process.  Disproportionate symmetric severe temporal lobe volume loss can be seen with neurodegenerative disorders though is nonspecific.  CT cervical spine:  No acute fracture nor malalignment.   Electronically Signed   By: Awilda Metroourtnay  Bloomer   On: 07/21/2013 00:33   Ct Cervical Spine Wo Contrast  07/21/2013   CLINICAL DATA:  FALL head injury; trauma. Seizure. pain  EXAM: CT HEAD WITHOUT CONTRAST  CT CERVICAL SPINE WITHOUT CONTRAST  TECHNIQUE: Multidetector CT imaging of the head and cervical spine was performed following the standard protocol without intravenous contrast. Multiplanar CT image reconstructions of the cervical spine were also generated.  COMPARISON:  MR C SPINE W/O CM dated  03/11/2013; MR HEAD W/O CM dated 04/25/2011  FINDINGS: CT HEAD FINDINGS  Severe disproportionate symmetric temporal lobe volume loss with ex vacuo dilatation of the temporal horns, similar to prior examination. No intraparenchymal hemorrhage, mass effect nor midline shift. Patchy supratentorial white matter hypodensities are less than expected for patient's age and though non-specific suggest sequelae of chronic small vessel ischemic disease. No acute large vascular territory infarcts.  No abnormal extra-axial fluid collections. Basal cisterns are patent. Minimal calcific atherosclerosis of the carotid siphons.  Small left frontal scalp hematoma. No skull fracture. Visualized paranasal sinuses and mastoid aircells are well-aerated. The included ocular globes and orbital contents are non-suspicious.  CT CERVICAL SPINE FINDINGS  Cervical vertebral bodies and posterior elements are intact and aligned with maintenance of cervical lordosis. Moderate C4-5 and C5-6 degenerative disc disease, relatively unchanged. C1-2 articulation maintained with mild arthropathy. No destructive bony lesions. A right paraspinal subcutaneous mass was 14 mm in craniocaudad dimension comment now 17 mm and could reflect a sebaceous cyst though recommend correlation with physical examination. Mild calcific atherosclerosis of the carotid siphons.  IMPRESSION: CT head: Small left frontal scalp hematoma without underlying skull fracture and no acute intracranial process.  Disproportionate symmetric severe temporal lobe volume loss can be seen with neurodegenerative disorders though is nonspecific.  CT cervical spine:  No acute fracture nor malalignment.   Electronically Signed   By: Awilda Metroourtnay  Bloomer   On: 07/21/2013 00:33   Dg Hand Complete Right  07/21/2013   CLINICAL DATA:  Fall with injury in bruising of right hand.  EXAM: RIGHT HAND - COMPLETE 3+ VIEW  COMPARISON:  None.  FINDINGS: There is no evidence of acute fracture or dislocation. Mild  degenerative changes are seen involving the first digit. No bony lesions are identified. Soft tissues are unremarkable.  IMPRESSION: No acute fracture.   Electronically Signed   By: Irish LackGlenn  Yamagata M.D.   On: 07/21/2013 00:24  I personally reviewed the imaging tests through PACS system I reviewed available ER/hospitalization records through the EMR    EKG Interpretation None      MDM   Final diagnoses:  Facial laceration  Head injury  Contusion of right hand  Laceration repaired.  CT head C-spine negative.  Discharge home in good condition.  Negative when films of the right hand.  Abdominal exam benign.  Infection warnings given.  Head injury warnings given.  PCP followup.    Lyanne Co, MD 07/21/13 [phone]

## 2013-07-21 NOTE — ED Notes (Signed)
Dr. Campos at bedside   

## 2013-10-17 ENCOUNTER — Emergency Department (HOSPITAL_COMMUNITY)
Admission: EM | Admit: 2013-10-17 | Discharge: 2013-10-17 | Disposition: A | Discharge status: Home / Self Care | Payer: Medicare PPO | Attending: Emergency Medicine | Admitting: Emergency Medicine

## 2013-10-17 ENCOUNTER — Encounter (HOSPITAL_COMMUNITY): Payer: Self-pay | Admitting: Emergency Medicine

## 2013-10-17 ENCOUNTER — Emergency Department (HOSPITAL_COMMUNITY): Payer: Medicare PPO

## 2013-10-17 DIAGNOSIS — Z87891 Personal history of nicotine dependence: Secondary | ICD-10-CM | POA: Insufficient documentation

## 2013-10-17 DIAGNOSIS — S22070A Wedge compression fracture of T9-T10 vertebra, initial encounter for closed fracture: Secondary | ICD-10-CM

## 2013-10-17 DIAGNOSIS — F329 Major depressive disorder, single episode, unspecified: Secondary | ICD-10-CM | POA: Insufficient documentation

## 2013-10-17 DIAGNOSIS — M069 Rheumatoid arthritis, unspecified: Secondary | ICD-10-CM | POA: Insufficient documentation

## 2013-10-17 DIAGNOSIS — Z79899 Other long term (current) drug therapy: Secondary | ICD-10-CM | POA: Insufficient documentation

## 2013-10-17 DIAGNOSIS — Y9289 Other specified places as the place of occurrence of the external cause: Secondary | ICD-10-CM | POA: Insufficient documentation

## 2013-10-17 DIAGNOSIS — Y939 Activity, unspecified: Secondary | ICD-10-CM | POA: Insufficient documentation

## 2013-10-17 DIAGNOSIS — E039 Hypothyroidism, unspecified: Secondary | ICD-10-CM | POA: Insufficient documentation

## 2013-10-17 DIAGNOSIS — R296 Repeated falls: Secondary | ICD-10-CM | POA: Insufficient documentation

## 2013-10-17 DIAGNOSIS — S22009A Unspecified fracture of unspecified thoracic vertebra, initial encounter for closed fracture: Secondary | ICD-10-CM | POA: Insufficient documentation

## 2013-10-17 DIAGNOSIS — Z7982 Long term (current) use of aspirin: Secondary | ICD-10-CM | POA: Insufficient documentation

## 2013-10-17 DIAGNOSIS — F3289 Other specified depressive episodes: Secondary | ICD-10-CM | POA: Insufficient documentation

## 2013-10-17 MED ORDER — OXYCODONE-ACETAMINOPHEN 5-325 MG PO TABS
1.0000 | ORAL_TABLET | Freq: Once | ORAL | Status: AC
  Administered 2013-10-17: 1 via ORAL
  Filled 2013-10-17: qty 1

## 2013-10-17 MED ORDER — ONDANSETRON 4 MG PO TBDP
8.0000 mg | ORAL_TABLET | Freq: Once | ORAL | Status: AC
  Administered 2013-10-17: 8 mg via ORAL
  Filled 2013-10-17: qty 2

## 2013-10-17 MED ORDER — HYDROCODONE-ACETAMINOPHEN 5-325 MG PO TABS: 1.0000 | ORAL_TABLET | Freq: Four times a day (QID) | ORAL | Status: DC | PRN

## 2013-10-17 NOTE — ED Provider Notes (Signed)
Medical screening examination/treatment/procedure(s) were performed by non-physician practitioner and as supervising physician I was immediately available for consultation/collaboration.   EKG Interpretation None        Glynn Octave, MD 10/17/13 2222

## 2013-10-17 NOTE — ED Provider Notes (Signed)
CSN: [id number]     Arrival date & time 10/17/13  1351 History   First MD Initiated Contact with Patient 10/17/13 1650     Chief Complaint  Patient presents with  . Back Pain     (Consider location/radiation/quality/duration/timing/severity/associated sxs/prior Treatment) HPI Comments: Patient is a 68 year old female with history of thoracic compression fracture.  She lost her balance last night and fell, injuring her back.  She denies any radiation to the arms or legs.  She denies any bowel or bladder incontinence.    Patient is a 68 y.o. female presenting with back pain. The history is provided by the patient.  Back Pain Location:  Thoracic spine Quality:  Stabbing Radiates to:  Does not radiate Pain severity:  Moderate Onset quality:  Sudden Duration:  12 hours Timing:  Constant Progression:  Unchanged Chronicity:  New Context: falling   Relieved by:  Nothing Worsened by:  Bending, movement and twisting Ineffective treatments:  None tried Associated symptoms: no bladder incontinence, no bowel incontinence, no numbness and no weakness     Past Medical History  Diagnosis Date  . Osteoporosis   . Hypothyroid   . Depression   . Rheumatoid arthritis(714.0)   . DDD (degenerative disc disease)     bone spurs in spine also   Past Surgical History  Procedure Laterality Date  . Tonsillectomy    . Wisdom tooth extraction    . Abdominal hysterectomy      also removed left ovary  . Abdominal hysterectomy    . Orif humerus fracture  08/22/2011    Procedure: OPEN REDUCTION INTERNAL FIXATION (ORIF) HUMERAL SHAFT FRACTURE;  Surgeon: Kathryne Hitch, MD;  Location: [facility];  Service: Orthopedics;  Laterality: Left;  Open Reduction Internal Fixation Left Humeral Shaft Fracture   Family History  Problem Relation Age of Onset  . Heart attack Mother   . Cancer Father   . Heart attack Brother   . Cancer Other   . Heart attack Other    History  Substance Use Topics  .  Smoking status: Former Smoker -- 2.00 packs/day    Types: Cigarettes    Quit date: 05/15/1991  . Smokeless tobacco: Never Used  . Alcohol Use: Yes     Comment: sober for 30 years  history of alcoholism   OB History   Grav Para Term Preterm Abortions TAB SAB Ect Mult Living                 Review of Systems  Gastrointestinal: Negative for bowel incontinence.  Genitourinary: Negative for bladder incontinence.  Musculoskeletal: Positive for back pain.  Neurological: Negative for weakness and numbness.  All other systems reviewed and are negative.     Allergies  Sulfa antibiotics  Home Medications   Prior to Admission medications   Medication Sig Start Date End Date Taking? Authorizing Provider  acetaminophen (TYLENOL) 500 MG tablet Take 1,000 mg by mouth 2 (two) times daily.    Historical Provider, MD  aspirin 81 MG tablet Take 81 mg by mouth daily.     Historical Provider, MD  calcium citrate-vitamin D 200-200 MG-UNIT TABS Take 1 tablet by mouth daily.    Historical Provider, MD  Cyanocobalamin (VITAMIN B 12 PO) Take 1 tablet by mouth daily.    Historical Provider, MD  DULoxetine (CYMBALTA) 60 MG capsule Take 60 mg by mouth daily.    Historical Provider, MD  etanercept (ENBREL) 50 MG/ML injection Inject 50 mg into the skin once  a week. On tuesdays.    Historical Provider, MD  fluticasone (VERAMYST) 27.5 MCG/SPRAY nasal spray Place 2 sprays into the nose daily as needed. For stuffy nose    Historical Provider, MD  levothyroxine (SYNTHROID, LEVOTHROID) 100 MCG tablet Take 100 mcg by mouth daily before breakfast.    Historical Provider, MD  magnesium oxide (MAG-OX) 400 MG tablet Take 400 mg by mouth daily.    Historical Provider, MD  methotrexate (RHEUMATREX) 2.5 MG tablet Take 7.5 mg by mouth 2 (two) times daily. On Mondays. 3 tablets at lunch and 3 tablets at dinner    Historical Provider, MD  prenatal vitamin w/FE, FA (PRENATAL 1 + 1) 27-1 MG TABS Take 1 tablet by mouth daily.     Historical Provider, MD   BP 115/71  Pulse 86  Temp(Src) 98.1 F (36.7 C) (Oral)  Resp 18  SpO2 96% Physical Exam  Nursing note and vitals reviewed. Constitutional: She is oriented to person, place, and time. She appears well-developed and well-nourished. No distress.  HENT:  Head: Normocephalic and atraumatic.  Neck: Normal range of motion. Neck supple.  Musculoskeletal: Normal range of motion.  There is ttp in the lower thoracic / upper lumbar region.  There is no bony ttp or stepoffs.    Neurological: She is alert and oriented to person, place, and time.  DTR's are 1+ and equal in the bilateral lower extremities.  Strength is 5/5 in the ble.    Skin: Skin is warm and dry. She is not diaphoretic.    ED Course  Procedures (including critical care time) Labs Review Labs Reviewed - No data to display  Imaging Review Dg Lumbar Spine Complete  10/17/2013   CLINICAL DATA:  Status post fall.  Back pain.  EXAM: LUMBAR SPINE - COMPLETE 4+ VIEW  COMPARISON:  Two views lumbar and thoracic spine 06/25/2013 and MRI lumbar spine 02/01/2013.  FINDINGS: Mild inferior endplate compression fracture of L3 is unchanged in appearance. There has been progressive vertebral body height loss at T10 where a fracture is identified on the comparison thoracic spine films. No new fracture is seen. Multilevel facet arthropathy is identified. Loss of disc space height worst at L5-S1.  IMPRESSION: No acute finding.  Progressive vertebral body height loss at T10 where a compression fracture is seen on comparison plain films.  No change in a mild inferior endplate compression fracture of L3.   Electronically Signed   By: Drusilla Kanner M.D.   On: 10/17/2013 16:29     EKG Interpretation None      MDM   Final diagnoses:  None    Xrays reveal a worsening T10 compression fracture.  There are no strength or reflex deficits and no sign of cord compression.  She was given percocet in triage and is feeling  somewhat better.  Will treat with hydrocodone and follow up with pcp.      Geoffery Lyons, MD 10/17/13 1754

## 2013-10-17 NOTE — ED Notes (Signed)
The pt fell last pm and she injured her lower back.  She has been in bed most of the day.  Severe pain at present

## 2013-10-17 NOTE — ED Notes (Signed)
She is currently getting shots in her spine for the pain

## 2013-10-17 NOTE — Discharge Instructions (Signed)
Hydrocodone as prescribed as needed for pain.  Follow up with your primary doctor in the next week for a recheck.   Return to the ER if you develop worsening pain, or other new and concerning symptoms.

## 2013-10-17 NOTE — ED Provider Notes (Signed)
Medical Screening Exam  68 year old patient with chronic back problems, difficulty walking with a walker at baseline, presents today after falling.  She now reports she cannot walk even with her walker, and is having uncontrolled pain.     Pt seen attempting to transfer, after being given a percocet, she is crying in pain and unable to assist in transfer, unable to stand on her own.   Pt was triaged to [facility].  Pt to be move to Main ED for full evaluation.     Trixie Dredge, PA-C 10/17/13 1700

## 2014-04-08 ENCOUNTER — Other Ambulatory Visit: Payer: Self-pay | Admitting: Internal Medicine

## 2014-04-08 DIAGNOSIS — R413 Other amnesia: Secondary | ICD-10-CM

## 2014-04-12 ENCOUNTER — Other Ambulatory Visit: Payer: Medicare PPO

## 2014-04-19 ENCOUNTER — Ambulatory Visit
Admission: RE | Admit: 2014-04-19 | Discharge: 2014-04-19 | Disposition: A | Discharge status: Home / Self Care | Payer: Medicare PPO | Source: Ambulatory Visit | Attending: Internal Medicine | Admitting: Internal Medicine

## 2014-04-19 DIAGNOSIS — R413 Other amnesia: Secondary | ICD-10-CM

## 2014-05-12 ENCOUNTER — Institutional Professional Consult (permissible substitution): Payer: Medicare PPO | Admitting: Neurology

## 2014-07-27 ENCOUNTER — Emergency Department (HOSPITAL_COMMUNITY): Payer: Medicare PPO

## 2014-07-27 ENCOUNTER — Emergency Department (HOSPITAL_COMMUNITY)
Admission: EM | Admit: 2014-07-27 | Discharge: 2014-07-28 | Disposition: A | Discharge status: Home / Self Care | Payer: Medicare PPO | Source: Home / Self Care | Attending: Emergency Medicine | Admitting: Emergency Medicine

## 2014-07-27 ENCOUNTER — Encounter (HOSPITAL_COMMUNITY): Payer: Self-pay | Admitting: *Deleted

## 2014-07-27 DIAGNOSIS — M069 Rheumatoid arthritis, unspecified: Secondary | ICD-10-CM | POA: Insufficient documentation

## 2014-07-27 DIAGNOSIS — E039 Hypothyroidism, unspecified: Secondary | ICD-10-CM

## 2014-07-27 DIAGNOSIS — Z79899 Other long term (current) drug therapy: Secondary | ICD-10-CM

## 2014-07-27 DIAGNOSIS — M25561 Pain in right knee: Secondary | ICD-10-CM | POA: Diagnosis present

## 2014-07-27 DIAGNOSIS — W19XXXA Unspecified fall, initial encounter: Secondary | ICD-10-CM | POA: Diagnosis present

## 2014-07-27 DIAGNOSIS — S023XXA Fracture of orbital floor, initial encounter for closed fracture: Secondary | ICD-10-CM | POA: Diagnosis present

## 2014-07-27 DIAGNOSIS — Z7951 Long term (current) use of inhaled steroids: Secondary | ICD-10-CM

## 2014-07-27 DIAGNOSIS — F329 Major depressive disorder, single episode, unspecified: Secondary | ICD-10-CM | POA: Diagnosis present

## 2014-07-27 DIAGNOSIS — Z7982 Long term (current) use of aspirin: Secondary | ICD-10-CM

## 2014-07-27 DIAGNOSIS — M810 Age-related osteoporosis without current pathological fracture: Secondary | ICD-10-CM | POA: Diagnosis present

## 2014-07-27 DIAGNOSIS — Y9389 Activity, other specified: Secondary | ICD-10-CM | POA: Insufficient documentation

## 2014-07-27 DIAGNOSIS — F0150 Vascular dementia without behavioral disturbance: Secondary | ICD-10-CM | POA: Diagnosis present

## 2014-07-27 DIAGNOSIS — W1839XA Other fall on same level, initial encounter: Secondary | ICD-10-CM | POA: Insufficient documentation

## 2014-07-27 DIAGNOSIS — K5900 Constipation, unspecified: Secondary | ICD-10-CM | POA: Diagnosis not present

## 2014-07-27 DIAGNOSIS — S82001A Unspecified fracture of right patella, initial encounter for closed fracture: Secondary | ICD-10-CM | POA: Diagnosis not present

## 2014-07-27 DIAGNOSIS — S62318A Displaced fracture of base of other metacarpal bone, initial encounter for closed fracture: Secondary | ICD-10-CM

## 2014-07-27 DIAGNOSIS — Y9289 Other specified places as the place of occurrence of the external cause: Secondary | ICD-10-CM

## 2014-07-27 DIAGNOSIS — E43 Unspecified severe protein-calorie malnutrition: Secondary | ICD-10-CM | POA: Diagnosis present

## 2014-07-27 DIAGNOSIS — S0285XA Fracture of orbit, unspecified, initial encounter for closed fracture: Secondary | ICD-10-CM

## 2014-07-27 DIAGNOSIS — Z87891 Personal history of nicotine dependence: Secondary | ICD-10-CM

## 2014-07-27 DIAGNOSIS — Y998 Other external cause status: Secondary | ICD-10-CM

## 2014-07-27 DIAGNOSIS — R000 Tachycardia, unspecified: Secondary | ICD-10-CM | POA: Diagnosis present

## 2014-07-27 DIAGNOSIS — S32591A Other specified fracture of right pubis, initial encounter for closed fracture: Secondary | ICD-10-CM | POA: Diagnosis present

## 2014-07-27 DIAGNOSIS — S42031A Displaced fracture of lateral end of right clavicle, initial encounter for closed fracture: Secondary | ICD-10-CM | POA: Diagnosis present

## 2014-07-27 DIAGNOSIS — S02401A Maxillary fracture, unspecified, initial encounter for closed fracture: Secondary | ICD-10-CM | POA: Diagnosis present

## 2014-07-27 DIAGNOSIS — Z791 Long term (current) use of non-steroidal anti-inflammatories (NSAID): Secondary | ICD-10-CM

## 2014-07-27 DIAGNOSIS — Z882 Allergy status to sulfonamides status: Secondary | ICD-10-CM

## 2014-07-27 DIAGNOSIS — Z515 Encounter for palliative care: Secondary | ICD-10-CM

## 2014-07-27 DIAGNOSIS — Z66 Do not resuscitate: Secondary | ICD-10-CM | POA: Diagnosis present

## 2014-07-27 DIAGNOSIS — S62316A Displaced fracture of base of fifth metacarpal bone, right hand, initial encounter for closed fracture: Secondary | ICD-10-CM

## 2014-07-27 DIAGNOSIS — M2570 Osteophyte, unspecified joint: Secondary | ICD-10-CM | POA: Diagnosis present

## 2014-07-27 DIAGNOSIS — S62308A Unspecified fracture of other metacarpal bone, initial encounter for closed fracture: Secondary | ICD-10-CM | POA: Diagnosis present

## 2014-07-27 DIAGNOSIS — F0390 Unspecified dementia without behavioral disturbance: Secondary | ICD-10-CM | POA: Insufficient documentation

## 2014-07-27 DIAGNOSIS — S42001A Fracture of unspecified part of right clavicle, initial encounter for closed fracture: Secondary | ICD-10-CM

## 2014-07-27 DIAGNOSIS — Z681 Body mass index (BMI) 19 or less, adult: Secondary | ICD-10-CM

## 2014-07-27 DIAGNOSIS — Z993 Dependence on wheelchair: Secondary | ICD-10-CM

## 2014-07-27 DIAGNOSIS — Z90710 Acquired absence of both cervix and uterus: Secondary | ICD-10-CM

## 2014-07-27 DIAGNOSIS — I10 Essential (primary) hypertension: Secondary | ICD-10-CM | POA: Diagnosis present

## 2014-07-27 LAB — URINALYSIS, ROUTINE W REFLEX MICROSCOPIC
BILIRUBIN URINE: NEGATIVE
Glucose, UA: NEGATIVE mg/dL
HGB URINE DIPSTICK: NEGATIVE
KETONES UR: NEGATIVE mg/dL
Leukocytes, UA: NEGATIVE
NITRITE: NEGATIVE
Protein, ur: NEGATIVE mg/dL
Specific Gravity, Urine: 1.011 (ref 1.005–1.030)
Urobilinogen, UA: 0.2 mg/dL (ref 0.0–1.0)
pH: 7.5 (ref 5.0–8.0)

## 2014-07-27 LAB — CBC WITH DIFFERENTIAL/PLATELET
BASOS ABS: 0 10*3/uL (ref 0.0–0.1)
BASOS PCT: 0 % (ref 0–1)
Eosinophils Absolute: 0 10*3/uL (ref 0.0–0.7)
Eosinophils Relative: 0 % (ref 0–5)
HEMATOCRIT: 40.2 % (ref 36.0–46.0)
HEMOGLOBIN: 13.5 g/dL (ref 12.0–15.0)
Lymphocytes Relative: 11 % — ABNORMAL LOW (ref 12–46)
Lymphs Abs: 0.9 10*3/uL (ref 0.7–4.0)
MCH: 33.8 pg (ref 26.0–34.0)
MCHC: 33.6 g/dL (ref 30.0–36.0)
MCV: 100.5 fL — ABNORMAL HIGH (ref 78.0–100.0)
Monocytes Absolute: 0.7 10*3/uL (ref 0.1–1.0)
Monocytes Relative: 8 % (ref 3–12)
NEUTROS ABS: 6.9 10*3/uL (ref 1.7–7.7)
NEUTROS PCT: 81 % — AB (ref 43–77)
Platelets: 170 10*3/uL (ref 150–400)
RBC: 4 MIL/uL (ref 3.87–5.11)
RDW: 12.6 % (ref 11.5–15.5)
WBC: 8.6 10*3/uL (ref 4.0–10.5)

## 2014-07-27 LAB — I-STAT CHEM 8, ED
BUN: 6 mg/dL (ref 6–23)
CALCIUM ION: 1.23 mmol/L (ref 1.13–1.30)
CHLORIDE: 97 mmol/L (ref 96–112)
Creatinine, Ser: 0.6 mg/dL (ref 0.50–1.10)
Glucose, Bld: 114 mg/dL — ABNORMAL HIGH (ref 70–99)
HCT: 43 % (ref 36.0–46.0)
Hemoglobin: 14.6 g/dL (ref 12.0–15.0)
Potassium: 3.7 mmol/L (ref 3.5–5.1)
Sodium: 139 mmol/L (ref 135–145)
TCO2: 27 mmol/L (ref 0–100)

## 2014-07-27 MED ORDER — FENTANYL CITRATE (PF) 100 MCG/2ML IJ SOLN
50.0000 ug | Freq: Once | INTRAMUSCULAR | Status: AC
  Administered 2014-07-27: 50 ug via INTRAVENOUS
  Filled 2014-07-27: qty 2

## 2014-07-27 MED ORDER — FENTANYL CITRATE (PF) 100 MCG/2ML IJ SOLN
25.0000 ug | Freq: Once | INTRAMUSCULAR | Status: AC
  Administered 2014-07-27: 25 ug via INTRAVENOUS

## 2014-07-27 MED ORDER — HYDROCODONE-ACETAMINOPHEN 5-325 MG PO TABS
1.0000 | ORAL_TABLET | Freq: Four times a day (QID) | ORAL | Status: DC | PRN
Start: 2014-07-27 — End: 2014-08-07

## 2014-07-27 MED ORDER — CEPHALEXIN 500 MG PO CAPS: 500.0000 mg | ORAL_CAPSULE | Freq: Three times a day (TID) | ORAL | Status: AC

## 2014-07-27 MED ORDER — HYDROCODONE-ACETAMINOPHEN 5-325 MG PO TABS
1.0000 | ORAL_TABLET | Freq: Once | ORAL | Status: AC
  Administered 2014-07-27: 1 via ORAL
  Filled 2014-07-27: qty 1

## 2014-07-27 MED ORDER — FENTANYL CITRATE (PF) 100 MCG/2ML IJ SOLN
25.0000 ug | Freq: Once | INTRAMUSCULAR | Status: AC
  Administered 2014-07-27: 25 ug via INTRAVENOUS
  Filled 2014-07-27: qty 2

## 2014-07-27 MED ORDER — LIDOCAINE HCL (PF) 1 % IJ SOLN
5.0000 mL | Freq: Once | INTRAMUSCULAR | Status: AC
  Administered 2014-07-27: 5 mL via INTRADERMAL
  Filled 2014-07-27: qty 5

## 2014-07-27 MED ORDER — ONDANSETRON HCL 4 MG/2ML IJ SOLN
4.0000 mg | Freq: Once | INTRAMUSCULAR | Status: AC
  Administered 2014-07-27: 4 mg via INTRAVENOUS
  Filled 2014-07-27: qty 2

## 2014-07-27 NOTE — ED Provider Notes (Signed)
Medical screening examination/treatment/procedure(s) were conducted as a shared visit with non-physician practitioner(s) and myself.  I personally evaluated the patient during the encounter.   EKG Interpretation   Date/Time:  Monday July 27 2014 10:57:34 EDT Ventricular Rate:  86 PR Interval:  163 QRS Duration: 70 QT Interval:  370 QTC Calculation: 442 R Axis:   72 Text Interpretation:  Sinus rhythm Anteroseptal infarct, age indeterminate  No significant change since last tracing Confirmed by Socorro Ebron  MD, Idania Desouza  ([id number]) on 07/27/2014 11:10:45 AM     Patient here with right-sided facial laceration after a fall just prior to arrival. Has history of dementia. CT results noted. Laceration to be repaired and patient discharged back to home  Lorre Nick, MD 07/27/14 1443

## 2014-07-27 NOTE — ED Notes (Signed)
Pt is from [facility] [place].

## 2014-07-27 NOTE — ED Notes (Signed)
Beth, NP at bedside suturing pt.

## 2014-07-27 NOTE — ED Notes (Signed)
Ortho tech paged.  Will come to apply splint/immobilizer

## 2014-07-27 NOTE — ED Notes (Addendum)
Per EMS- pt had a fall this morning. Pt has laceration to rt side of forehead. Pt has bruising to rt eye. Pt had a nose bleed as well which has stopped. Pt reports rt hand pain. Pt denies LOC. Pt is unsure of events however. Pt has hx of dementia. Pt is alert and disoriented at this time. Denies neck or back pain. Pt noted to have bruising to rt shoulder as well.

## 2014-07-27 NOTE — ED Provider Notes (Signed)
69 year old female status post fall. Patient was signed out to me at shift change by Harle Battiest NP. Patient's fall significant for fracture of interior, medial, and posterior lateral walls of right maxillary sinus as well as right zygomatic arch and of the lateral wall of the right orbit with no signs of entrapment. She was also found to have distal right clavicular fracture, and a subtle nondisplaced fracture of the fifth metacarpal distally. Patient was placed in a shoulder immobilizer, wrist splint, and ENT was consulted for the facial fractures. Instructed to have patient use ice, antibiotics, and follow-up with them in the office in one week.   Patient was attempted to transfer from bed to urinal with extreme difficulty due to her hesitancy and fear of falling. Nursing staff contacted her living facility who reported that he would be able help with her in and out activities if she required wheelchair but cautioned that they are not  a skilled facility and would not be able to tailor her pain medication as needed.   Patient was treated with fentanyl 5 in the ED but still complained of pain and difficulty with transferring per nursing.   Repeat physical exam revealed no new signs or symptoms of injury in addition to those noted by previous provider.   I personally evaluated the patients attempts at transferring. She did not seem to be in any acute pain. Her husband reports that she can normally go from her bed to walker or wheelchair with little assistance requiring significant use of her upper extremities. He notes that she has progressively become more hesitant to walk due to previous falls, he believes this is not a physical limitation but rather her willingness and fear or falling preventing her. Pt was able to get from hospital bed to standing to seated with minimal discomfort with assistance.   Due to patients currently living situation and injuries I felt to reasonable to have her return  to [facility]. She will receive assistance getting in and out of bed and they can continue to use wheelchair for transporting her. Pt is on hydrocodone at home for chronic pain and I instructed her to continue using that as it would also help the hand and shoulder pain. Discharge instructions included a prescription for keflex that was indicated by ENT, and instruction to not blow her nose for two weeks. This was verbally conveyed to the patient and her husband. She was encouraged to follow up with ENT and Orthopedics and contact information was included in discharge instructions. Pt remained emotional yet stable throughout her stay in the ED.       Eyvonne Mechanic, PA-C 07/28/14 1223  Arby Barrette, MD 08/05/14 [phone]

## 2014-07-27 NOTE — ED Notes (Signed)
Pt returned from radiology. Pt returned to monitor. Family states that pt is more confused than normal.

## 2014-07-27 NOTE — Progress Notes (Signed)
Orthopedic Tech Progress Note Patient Details:  [name] 1945-07-25 [id number] Applied fiberglass ulnar gutter splint to RUE.  Pulses, sensation, motion intact before and after splinting.  Capillary refill less than 2 seconds before and after splinting.  Applied sling immobilizer to RUE. Ortho Devices Type of Ortho Device: Ulna gutter splint, Shoulder immobilizer Ortho Device/Splint Location: RUE Ortho Device/Splint Interventions: Application   Lesle Chris 07/27/2014, 5:48 PM

## 2014-07-27 NOTE — Discharge Instructions (Signed)
Please follow the directions provided. Be sure to follow up with the ENT doctor regarding the fractures to your facial bones. Be sure to follow up with the orthopedic doctor regarding the fracture to your collarbone. You may take the Vicodin for pain as needed every 6 hours. Don't hesitate to return for any new, worsening, or concerning symptoms.   SEEK IMMEDIATE MEDICAL CARE IF:  You develop difficulty seeing or experience double vision.  You become dizzy, lightheaded, or faint.  You develop trouble speaking, breathing, or swallowing.  You have a watery discharge from your nose or ear.    Boxer's Fracture Boxer's fracture is a broken bone (fracture) of the fourth or fifth metacarpal (ring or pinky finger). The metacarpal bones connect the wrist to the fingers and make up the arch of the hand. Boxer's fracture occurs toward the body (proximal) from the first knuckle. This injury is known as a boxer's fracture, because it often occurs from hitting an object with a closed fist. SYMPTOMS   Severe pain at the time of injury.  Pain and swelling around the first knuckle on the fourth or fifth finger.  Bruising (contusion) in the area within 48 hours of injury.  Visible deformity, such as a pushed down knuckle. This can occur if the fracture is complete, and the bone fragments separate enough to distort normal body shape.  Numbness or paralysis from swelling in the hand, causing pressure on the blood vessels or nerves (uncommon). CAUSES   Direct injury (trauma), such as a striking blow with the fist.  Indirect stress to the hand, such as twisting or violent muscle contraction (uncommon). RISK INCREASES WITH:  Punching an object with an unprotected knuckle.  Contact sports (football, rugby).  Sports that require hitting (boxing, martial arts).  History of bone or joint disease (osteoporosis). PREVENTION  Maintain physical fitness:  Muscle strength and  flexibility.  Endurance.  Cardiovascular fitness.  For participation in contact sports, wear proper protective equipment for the hand and make sure it fits properly.  Learn and use proper technique when hitting or punching. PROGNOSIS  When proper treatment is given, to ensure normal alignment of the bones, healing can usually be expected in 4 to 6 weeks. Occasionally, surgery is necessary.  RELATED COMPLICATIONS   Bone does not heal back together (nonunion).  Bone heals together in an improper position (malunion), causing twisting of the finger when making a fist.  Chronic pain, stiffness, or swelling of the hand.  Excessive bleeding in the hand, causing pressure and injury to nerves and blood vessels (rare).  Stopping of normal hand growth in children.  Infection of the wound, if skin is broken over the fracture (open fracture), or at the incision site if surgery is performed.  Shortening of injured bones.  Bony bump (prominence) in the palm or loss of shape of the knuckles.  Pain and weakness when gripping.  Arthritis of the affected joint, if the fracture goes into the joint, after repeated injury, or after delayed treatment.  Scarring around the knuckle, and limited motion. TREATMENT  Treatment varies, depending on the injury. The place in the hand where the injury occurs has a great deal of motion, which allows the hand to move properly. If the fracture is not aligned properly, this function may be decreased. If the bone ends are in proper alignment, treatment first involves ice and elevation of the injured hand, at or above heart level, to reduce inflammation. Pain medicines help to relieve pain. Treatment also  involves restraint by splinting, bandaging, casting, or bracing for 4 or more weeks.  If the fracture is out of alignment (displaced), or it involves the joint, surgery is usually recommended. Surgery typically involves cutting through the skin to place removable  pins, screws, and sometimes plates over the fracture. After surgery, the bone and joint are restrained for 4 or more weeks. After restraint (with or without surgery), stretching and strengthening exercises are needed to regain proper strength and function of the joint. Exercises may be done at home or with the assistance of a therapist. Depending on the sport and position played, a brace or splint may be recommended when first returning to sports.  MEDICATION   If pain medication is necessary, nonsteroidal anti-inflammatory medications, such as aspirin and ibuprofen, or other minor pain relievers, such as acetaminophen, are often recommended.  Do not take pain medication for 7 days before surgery.  Prescription pain relievers may be necessary. Use only as directed and only as much as you need. COLD THERAPY Cold treatment (icing) relieves pain and reduces inflammation. Cold treatment should be applied for 10 to 15 minutes every 2 to 3 hours for inflammation and pain, and immediately after any activity that aggravates your symptoms. Use ice packs or an ice massage. SEEK MEDICAL CARE IF:   Pain, tenderness, or swelling gets worse, despite treatment.  You experience pain, numbness, or coldness in the hand.  Blue, gray, or dark color appears in the fingernails.  You develop signs of infection, after surgery (fever, increased pain, swelling, redness, drainage of fluids, or bleeding in the surgical area).  You feel you have reinjured the hand.  New, unexplained symptoms develop. (Drugs used in treatment may produce side effects.) Document Released: 03/27/2005 Document Revised: 06/19/2011 Document Reviewed: 07/09/2008 [facility] Patient Information 2015 [place], [place]. This information is not intended to replace advice given to you by your health care provider. Make sure you discuss any questions you have with your health care provider.  Acromioclavicular Injuries The AC (acromioclavicular) joint  is the joint in the shoulder where the collarbone (clavicle) meets the shoulder blade (scapula). The part of the shoulder blade connected to the collarbone is called the acromion. Common problems with and treatments for the [facility] joint are detailed below. ARTHRITIS Arthritis occurs when the joint has been injured and the smooth padding between the joints (cartilage) is lost. This is the wear and tear seen in most joints of the body if they have been overused. This causes the joint to produce pain and swelling which is worse with activity.  AC JOINT SEPARATION AC joint separation means that the ligaments connecting the acromion of the shoulder blade and collarbone have been damaged, and the two bones no longer line up. AC separations can be anywhere from mild to severe, and are "graded" depending upon which ligaments are torn and how badly they are torn.  Grade I Injury: the least damage is done, and the [facility] joint still lines up.  Grade II Injury: damage to the ligaments which reinforce the [facility] joint. In a Grade II injury, these ligaments are stretched but not entirely torn. When stressed, the [facility] joint becomes painful and unstable.  Grade III Injury: AC and secondary ligaments are completely torn, and the collarbone is no longer attached to the shoulder blade. This results in deformity; a prominence of the end of the clavicle. AC JOINT FRACTURE AC joint fracture means that there has been a break in the bones of the [facility] joint, usually  the end of the clavicle. TREATMENT TREATMENT OF AC ARTHRITIS  There is currently no way to replace the cartilage damaged by arthritis. The best way to improve the condition is to decrease the activities which aggravate the problem. Application of ice to the joint helps decrease pain and soreness (inflammation). The use of non-steroidal anti-inflammatory medication is helpful.  If less conservative measures do not work, then cortisone shots (injections) may be used. These are  anti-inflammatories; they decrease the soreness in the joint and swelling.  If non-surgical measures fail, surgery may be recommended. The procedure is generally removal of a portion of the end of the clavicle. This is the part of the collarbone closest to your acromion which is stabilized with ligaments to the acromion of the shoulder blade. This surgery may be performed using a tube-like instrument with a light (arthroscope) for looking into a joint. It may also be performed as an open surgery through a small incision by the surgeon. Most patients will have good range of motion within 6 weeks and may return to all activity including sports by 8-12 weeks, barring complications. TREATMENT OF AN AC SEPARATION  The initial treatment is to decrease pain. This is best accomplished by immobilizing the arm in a sling and placing an ice pack to the shoulder for 20 to 30 minutes every 2 hours as needed. As the pain starts to subside, it is important to begin moving the fingers, wrist, elbow and eventually the shoulder in order to prevent a stiff or "frozen" shoulder. Instruction on when and how much to move the shoulder will be provided by your caregiver. The length of time needed to regain full motion and function depends on the amount or grade of the injury. Recovery from a Grade I AC separation usually takes 10 to 14 days, whereas a Grade III may take 6 to 8 weeks.  Grade I and II separations usually do not require surgery. Even Grade III injuries usually allow return to full activity with few restrictions. Treatment is also based on the activity demands of the injured shoulder. For example, a high level quarterback with an injured throwing arm will receive more aggressive treatment than someone with a desk job who rarely uses his/her arm for strenuous activities. In some cases, a painful lump may persist which could require a later surgery. Surgery can be very successful, but the benefits must be weighed against  the potential risks. TREATMENT OF AN AC JOINT FRACTURE Fracture treatment depends on the type of fracture. Sometimes a splint or sling may be all that is required. Other times surgery may be required for repair. This is more frequently the case when the ligaments supporting the clavicle are completely torn. Your caregiver will help you with these decisions and together you can decide what will be the best treatment. HOME CARE INSTRUCTIONS   Apply ice to the injury for 15-20 minutes each hour while awake for 2 days. Put the ice in a plastic bag and place a towel between the bag of ice and skin.  If a sling has been applied, wear it constantly for as long as directed by your caregiver, even at night. The sling or splint can be removed for bathing or showering or as directed. Be sure to keep the shoulder in the same place as when the sling is on. Do not lift the arm.  If a figure-of-eight splint has been applied it should be tightened gently by another person every day. Tighten it enough to  keep the shoulders held back. Allow enough room to place the index finger between the body and strap. Loosen the splint immediately if there is numbness or tingling in the hands.  Take over-the-counter or prescription medicines for pain, discomfort or fever as directed by your caregiver.  If you or your child has received a follow up appointment, it is very important to keep that appointment in order to avoid long term complications, chronic pain or disability. SEEK MEDICAL CARE IF:   The pain is not relieved with medications.  There is increased swelling or discoloration that continues to get worse rather than better.  You or your child has been unable to follow up as instructed.  There is progressive numbness and tingling in the arm, forearm or hand. SEEK IMMEDIATE MEDICAL CARE IF:   The arm is numb, cold or pale.  There is increasing pain in the hand, forearm or fingers. MAKE SURE YOU:   Understand  these instructions.  Will watch your condition.  Will get help right away if you are not doing well or get worse. Document Released: 01/04/2005 Document Revised: 06/19/2011 Document Reviewed: 06/29/2008 [facility] Patient Information 2015 [place], [place]. This information is not intended to replace advice given to you by your health care provider. Make sure you discuss any questions you have with your health care provider.  Patient will need assistance getting from bed to wheelchair due to immobilized right extremity. Patient's pain will be managed using her previously prescribed hydrocodone as previously indicated. She should use ice on her face, and avoid blowing her nose for 2 weeks. Patient is expected to have some pain and discomfort at the site of injuries in an attempt be made to not use them. Patient was able to get out of bed in the emergency room with assistance she will require assistance at home. Orthopedics and ENT were consult to during the emergency room stay and follow-up visit is indicated. Their contact information is listed in the discharge instructions and appointment should be made.

## 2014-07-27 NOTE — ED Provider Notes (Signed)
CSN: [id number]     Arrival date & time 07/27/14  1047 History   First MD Initiated Contact with Patient 07/27/14 1051     Chief Complaint  Patient presents with  . Fall   (Consider location/radiation/quality/duration/timing/severity/associated sxs/prior Treatment) HPI Level V caveat due to dementia  [name] is a 69 yo female with history of dementia, presenting from nursing facility after a fall.  The fall was unwitnessed and the patient does not remember the fall. She reports pain to the right side of her head at her eyebrow, her right palmar aspect of her hand, and her right shoulder. She is unsure of LOC but denies current dizziness, chest pain, shortness of breath, or nausea    Past Medical History  Diagnosis Date  . Osteoporosis   . Hypothyroid   . Depression   . Rheumatoid arthritis(714.0)   . DDD (degenerative disc disease)     bone spurs in spine also   Past Surgical History  Procedure Laterality Date  . Tonsillectomy    . Wisdom tooth extraction    . Abdominal hysterectomy      also removed left ovary  . Abdominal hysterectomy    . Orif humerus fracture  08/22/2011    Procedure: OPEN REDUCTION INTERNAL FIXATION (ORIF) HUMERAL SHAFT FRACTURE;  Surgeon: Kathryne Hitch, MD;  Location: [facility];  Service: Orthopedics;  Laterality: Left;  Open Reduction Internal Fixation Left Humeral Shaft Fracture   Family History  Problem Relation Age of Onset  . Heart attack Mother   . Cancer Father   . Heart attack Brother   . Cancer Other   . Heart attack Other    History  Substance Use Topics  . Smoking status: Former Smoker -- 2.00 packs/day    Types: Cigarettes    Quit date: 05/15/1991  . Smokeless tobacco: Never Used  . Alcohol Use: Yes     Comment: sober for 30 years  history of alcoholism   OB History    No data available     Review of Systems  Unable to perform ROS: Dementia      Allergies  Sulfa antibiotics  Home Medications   Prior to  Admission medications   Medication Sig Start Date End Date Taking? Authorizing Provider  acetaminophen (TYLENOL) 500 MG tablet Take 1,000 mg by mouth 2 (two) times daily.    Historical Provider, MD  aspirin 81 MG tablet Take 81 mg by mouth daily.     Historical Provider, MD  calcium citrate-vitamin D 200-200 MG-UNIT TABS Take 1 tablet by mouth daily.    Historical Provider, MD  Cyanocobalamin (VITAMIN B 12 PO) Take 1 tablet by mouth daily.    Historical Provider, MD  DULoxetine (CYMBALTA) 60 MG capsule Take 60 mg by mouth daily.    Historical Provider, MD  etanercept (ENBREL) 50 MG/ML injection Inject 50 mg into the skin once a week. On tuesdays.    Historical Provider, MD  fluticasone (VERAMYST) 27.5 MCG/SPRAY nasal spray Place 2 sprays into the nose daily as needed. For stuffy nose    Historical Provider, MD  HYDROcodone-acetaminophen (NORCO) 5-325 MG per tablet Take 1 tablet by mouth every 6 (six) hours as needed. 10/17/13   Geoffery Lyons, MD  levothyroxine (SYNTHROID, LEVOTHROID) 100 MCG tablet Take 100 mcg by mouth daily before breakfast.    Historical Provider, MD  magnesium oxide (MAG-OX) 400 MG tablet Take 400 mg by mouth daily.    Historical Provider, MD  methotrexate (RHEUMATREX) 2.5  MG tablet Take 7.5 mg by mouth 2 (two) times daily. On Mondays. 3 tablets at lunch and 3 tablets at dinner    Historical Provider, MD  prenatal vitamin w/FE, FA (PRENATAL 1 + 1) 27-1 MG TABS Take 1 tablet by mouth daily.    Historical Provider, MD   There were no vitals taken for this visit. Physical Exam  Constitutional: She appears well-developed and well-nourished. No distress.  HENT:  Head: Normocephalic and atraumatic.  Mouth/Throat: Oropharynx is clear and moist.  3.5 cm right lateral supraorbital laceration. Bleeding controlled  Eyes: Conjunctivae and EOM are normal. Pupils are equal, round, and reactive to light.  Neck: Neck supple.  Cardiovascular: Normal rate, regular rhythm and intact distal  pulses.   Pulmonary/Chest: Effort normal and breath sounds normal. No respiratory distress. She has no wheezes. She has no rales. She exhibits no tenderness.  Abdominal: Soft. There is no tenderness.  Musculoskeletal: She exhibits tenderness.  Ecchymosis, TTP and decreased ROM due to pain in right shoulder, pain to right hand but no deformity noted.   Lymphadenopathy:    She has no cervical adenopathy.  Neurological: She is alert. She has normal strength. No cranial nerve deficit or sensory deficit. Coordination normal. GCS eye subscore is 4. GCS verbal subscore is 4. GCS motor subscore is 6.  Oriented to person and place  Skin: Skin is warm and dry. No rash noted. She is not diaphoretic.  Psychiatric: She has a normal mood and affect.  Nursing note and vitals reviewed.   ED Course  Procedures (including critical care time) LACERATION REPAIR Performed by: Harle Battiest Authorized by: Harle Battiest Consent: Verbal consent obtained. Risks and benefits: risks, benefits and alternatives were discussed Consent given by: patient Patient identity confirmed: provided demographic data Prepped and Draped in normal sterile fashion Wound explored  Laceration Location: right lateral orbit  Laceration Length: 3.5 cm  No Foreign Bodies seen or palpated  Anesthesia: local infiltration  Local anesthetic: lidocaine 1% without epinephrine  Anesthetic total: 4 ml  Irrigation method: syringe Amount of cleaning: standard  Skin closure: 6-0 prolene  Number of sutures: 3  Technique: simple interrupted  Patient tolerance: Patient tolerated the procedure well with no immediate complications.   Labs Review Labs Reviewed  CBC WITH DIFFERENTIAL/PLATELET - Abnormal; Notable for the following:    MCV 100.5 (*)    Neutrophils Relative % 81 (*)    Lymphocytes Relative 11 (*)    All other components within normal limits  I-STAT CHEM 8, ED - Abnormal; Notable for the following:     Glucose, Bld 114 (*)    All other components within normal limits  URINALYSIS, ROUTINE W REFLEX MICROSCOPIC    Imaging Review Dg Shoulder Right  07/27/2014   CLINICAL DATA:  Larey Seat this morning, bruising and pain RIGHT shoulder  EXAM: RIGHT SHOULDER - 2+ VIEW  COMPARISON:  None  FINDINGS: Diffuse osseous demineralization.  AC joint alignment normal.  Slight irregularity of the distal clavicle question age-indeterminate distal RIGHT clavicular fracture.  No additional fracture, dislocation or bone destruction.  Visualized RIGHT ribs intact.  IMPRESSION: Osseous demineralization with questionable age-indeterminate distal RIGHT clavicular fracture.  No additional focal bony abnormality.   Electronically Signed   By: Ulyses Southward M.D.   On: 07/27/2014 13:29   Ct Head Wo Contrast  07/27/2014   CLINICAL DATA:  Fall. Laceration right-sided forehead. Bruising to the right eye. Epistaxis. Initial encounter. Personal history of dementia.  EXAM: CT HEAD WITHOUT CONTRAST  CT CERVICAL SPINE WITHOUT CONTRAST  TECHNIQUE: Multidetector CT imaging of the head and cervical spine was performed following the standard protocol without intravenous contrast. Multiplanar CT image reconstructions of the cervical spine were also generated.  COMPARISON:  MRI brain 04/19/2014  FINDINGS: CT HEAD FINDINGS  There is marked cerebral atrophy particularly involving the parietal and temporal lobes bilaterally. The ventricular system is dilated proportionate to the degree of atrophy. Moderate white matter changes are again seen bilaterally.  No acute infarct, hemorrhage, or mass lesion is present.  A posterolateral right maxillary sinus fracture is present. There is a comminuted fracture involving the anterior wall of the right maxillary sinus is well. An orbital floor fracture is suspected. The fracture extends superiorly to involve the lateral wall of the right orbit. The globe is intact. There is a segmental fracture of the right zygomatic  arch.  Limited supraorbital soft tissue swelling is present without additional fractures.  CT CERVICAL SPINE FINDINGS  Cervical spine is imaged from the skullbase through T1-2. A hard collar is in place. Multilevel degenerative changes are present. Uncovertebral spurring is worse on the left at C3-4, C4-5, and C5-6. Left-sided osseous foraminal narrowing is present at these levels. No acute fracture or traumatic subluxation is present.  IMPRESSION: 1. Stable atrophy and white matter disease. The atrophy pattern is most prominent in the parietal and temporal lobes consistent with an Alzheimer's type pattern. 2. No acute intracranial abnormality. 3. Comminuted right maxillary sinus fracture with the blood layer within the sinus. 4. Suspect right orbital floor fracture. CT of the maxillofacial sinuses would be useful for further evaluation of this type fracture. 5. Comminuted right lateral orbital wall fracture and segmental right zygomatic arch fracture. 6. Degenerative changes of the cervical spine without evidence for acute trauma.   Electronically Signed   By: Marin Roberts M.D.   On: 07/27/2014 12:59   Ct Cervical Spine Wo Contrast  07/27/2014   CLINICAL DATA:  Fall. Laceration right-sided forehead. Bruising to the right eye. Epistaxis. Initial encounter. Personal history of dementia.  EXAM: CT HEAD WITHOUT CONTRAST  CT CERVICAL SPINE WITHOUT CONTRAST  TECHNIQUE: Multidetector CT imaging of the head and cervical spine was performed following the standard protocol without intravenous contrast. Multiplanar CT image reconstructions of the cervical spine were also generated.  COMPARISON:  MRI brain 04/19/2014  FINDINGS: CT HEAD FINDINGS  There is marked cerebral atrophy particularly involving the parietal and temporal lobes bilaterally. The ventricular system is dilated proportionate to the degree of atrophy. Moderate white matter changes are again seen bilaterally.  No acute infarct, hemorrhage, or mass  lesion is present.  A posterolateral right maxillary sinus fracture is present. There is a comminuted fracture involving the anterior wall of the right maxillary sinus is well. An orbital floor fracture is suspected. The fracture extends superiorly to involve the lateral wall of the right orbit. The globe is intact. There is a segmental fracture of the right zygomatic arch.  Limited supraorbital soft tissue swelling is present without additional fractures.  CT CERVICAL SPINE FINDINGS  Cervical spine is imaged from the skullbase through T1-2. A hard collar is in place. Multilevel degenerative changes are present. Uncovertebral spurring is worse on the left at C3-4, C4-5, and C5-6. Left-sided osseous foraminal narrowing is present at these levels. No acute fracture or traumatic subluxation is present.  IMPRESSION: 1. Stable atrophy and white matter disease. The atrophy pattern is most prominent in the parietal and temporal lobes consistent with an Alzheimer's type  pattern. 2. No acute intracranial abnormality. 3. Comminuted right maxillary sinus fracture with the blood layer within the sinus. 4. Suspect right orbital floor fracture. CT of the maxillofacial sinuses would be useful for further evaluation of this type fracture. 5. Comminuted right lateral orbital wall fracture and segmental right zygomatic arch fracture. 6. Degenerative changes of the cervical spine without evidence for acute trauma.   Electronically Signed   By: Marin Roberts M.D.   On: 07/27/2014 12:59   Dg Hand Complete Right  07/27/2014   CLINICAL DATA:  Right hand pain post fall this morning  EXAM: RIGHT HAND - COMPLETE 3+ VIEW  COMPARISON:  07/20/2013  FINDINGS: Three views of the right hand submitted. No displaced fracture or subluxation. There is cortical irregularity distal aspect of fifth metacarpal suspicious for subtle nondisplaced fracture. Clinical correlation is necessary.  IMPRESSION: No displaced fracture or subluxation. There  is cortical irregularity distal aspect of fifth metacarpal suspicious for subtle nondisplaced fracture. Clinical correlation is necessary.   Electronically Signed   By: Natasha Mead M.D.   On: 07/27/2014 13:12   Ct Maxillofacial Wo Cm  07/27/2014   CLINICAL DATA:  The patient fell and has right facial trauma with a laceration and facial fractures.  EXAM: CT MAXILLOFACIAL WITHOUT CONTRAST  TECHNIQUE: Multidetector CT imaging of the maxillofacial structures was performed. Multiplanar CT image reconstructions were also generated. A small metallic BB was placed on the right temple in order to reliably differentiate right from left.  COMPARISON:  None.  FINDINGS: There is a tripod fracture of the right facial bones with a slightly displaced fracture of the posterior aspect of the right zygomatic arch, anterior and lateral wall maxillary sinus fractures, nondisplaced hairline fractures through the medial wall of the right maxillary sinus, and minimally depressed fracture of the floor of the right orbit. The right inferior orbital rim appears to be intact. There is no entrapment of extraocular muscles of the right orbit. There is an blood fluid level in the right maxillary sinus.  Fracture of the lateral wall of the right orbit.  The other facial bones are normal.  Note is made of severe temporal lobe atrophy.  IMPRESSION: Fractures of the anterior, medial, and posterior lateral walls of the right maxillary sinus as well as of the right zygomatic arch and of the lateral wall of the right orbit. No muscle entrapment.   Electronically Signed   By: Francene Boyers M.D.   On: 07/27/2014 14:42     EKG Interpretation   Date/Time:  Monday July 27 2014 10:57:34 EDT Ventricular Rate:  86 PR Interval:  163 QRS Duration: 70 QT Interval:  370 QTC Calculation: 442 R Axis:   72 Text Interpretation:  Sinus rhythm Anteroseptal infarct, age indeterminate  No significant change since last tracing Confirmed by ALLEN  MD,  ANTHONY  ([id number]) on 07/27/2014 11:10:45 AM      MDM   Final diagnoses:  Fall, initial encounter  Orbital fracture, closed, initial encounter  Clavicle fracture, right, closed, initial encounter  Fracture of fifth metacarpal bone, closed, initial encounter   69 yo presenting after fall from standing while walking to the bathroom. CBC, I-stat 8, UA, Head CT, C-spine CT, Shoulder and hand x-ray.    1:00 PM: Contacted by radiologist recommending CT maxillofacial due to multiple orbital fractures noted on head CT.   Imaging reviewed: Ct maxillofacial shows fractures of the anterior, medial, and posterior lateral walls of the right maxillary sinus as well as of  the right zygomatic arch and of the lateral wall of the right orbit. Age-indeterminate distal right clavicular fracture and subtle nondisplaced fracture of right fifth metacarpal.  Laceration occurred < 8 hours ago, pressure irrigation performed and repair well-tolerated. Discussed with husband, suture home care and follow-up for wound check and suture removal in 7 days.   4:05 PM: At end of shift, hand-off report given to Burna Forts, PA-C.  Plan includes consulting ortho tech to splint pt's hand for fifth metacarpal fracture, sling for clavicle fracture and consult ENT regarding orbital fractures. Disposition will most likely be back to nursing facility with out pt follow-up.    Filed Vitals:   07/27/14 1730 07/27/14 1848 07/27/14 1931 07/28/14 0012  BP: 130/58 118/80 134/72 132/72  Pulse: 94 101 102 98  Temp:   98.7 F (37.1 C) 98.7 F (37.1 C)  TempSrc:   Oral Oral  Resp: 18 18 18 14   SpO2: 98% 98% 96% 97%   Meds given in ED:  Medications  lidocaine (PF) (XYLOCAINE) 1 % injection 5 mL (5 mLs Intradermal Given by Other 07/27/14 1521)  fentaNYL (SUBLIMAZE) injection 25 mcg (25 mcg Intravenous Given 07/27/14 1249)  ondansetron (ZOFRAN) injection 4 mg (4 mg Intravenous Given 07/27/14 1249)  fentaNYL (SUBLIMAZE) injection 25  mcg (25 mcg Intravenous Given 07/27/14 1336)  fentaNYL (SUBLIMAZE) injection 50 mcg (50 mcg Intravenous Given 07/27/14 1439)  fentaNYL (SUBLIMAZE) injection 50 mcg (50 mcg Intravenous Given 07/27/14 1554)  HYDROcodone-acetaminophen (NORCO/VICODIN) 5-325 MG per tablet 1 tablet (1 tablet Oral Given 07/27/14 1947)    Discharge Medication List as of 07/27/2014  7:27 PM      07/27/14 0000  HYDROcodone-acetaminophen (NORCO/VICODIN) 5-325 MG per tablet Every 6 hours PRN Discontinue Reprint 07/27/14 1544   07/27/14 0000  cephALEXin (KEFLEX) 500 MG capsule 3 times daily Discontinue Reprint 07/27/14 1929         Harle Battiest, NP 07/28/14 [phone]

## 2014-07-27 NOTE — ED Notes (Signed)
Pt attempted to ambulate to bedside commode with NT.  This RN and second NT needed to assist patient to commode and back in to bed.  Pt not placing pressure on either feet, sts "it hurts to bad to walk".  Provider made aware.

## 2014-07-27 NOTE — ED Notes (Signed)
Staff at [facility] called to discuss pt current status and needs upon returning home.  Facility concerned about pain control and pt's ability to ambulate (sts pt typically ambulates with a walker to and from the bed to the bathroom.  Pt uses wheelchair for movement to dining hall and other placed further away).  Staff sts pt is not overseen by any provider who can provide in-depth skills.  Provider made aware of conversation.

## 2014-07-30 ENCOUNTER — Emergency Department (HOSPITAL_COMMUNITY): Payer: Medicare PPO

## 2014-07-30 ENCOUNTER — Encounter (HOSPITAL_COMMUNITY): Payer: Self-pay

## 2014-07-30 ENCOUNTER — Inpatient Hospital Stay (HOSPITAL_COMMUNITY)
Admission: EM | Admit: 2014-07-30 | Discharge: 2014-08-07 | DRG: 562 | Disposition: A | Discharge status: Skilled Nursing Facility | Payer: Medicare PPO | Attending: Internal Medicine | Admitting: Internal Medicine

## 2014-07-30 DIAGNOSIS — E43 Unspecified severe protein-calorie malnutrition: Secondary | ICD-10-CM | POA: Diagnosis present

## 2014-07-30 DIAGNOSIS — Z993 Dependence on wheelchair: Secondary | ICD-10-CM | POA: Diagnosis not present

## 2014-07-30 DIAGNOSIS — F329 Major depressive disorder, single episode, unspecified: Secondary | ICD-10-CM | POA: Diagnosis present

## 2014-07-30 DIAGNOSIS — T07XXXA Unspecified multiple injuries, initial encounter: Secondary | ICD-10-CM

## 2014-07-30 DIAGNOSIS — E039 Hypothyroidism, unspecified: Secondary | ICD-10-CM | POA: Diagnosis present

## 2014-07-30 DIAGNOSIS — F0150 Vascular dementia without behavioral disturbance: Secondary | ICD-10-CM | POA: Diagnosis present

## 2014-07-30 DIAGNOSIS — K5900 Constipation, unspecified: Secondary | ICD-10-CM | POA: Diagnosis not present

## 2014-07-30 DIAGNOSIS — S32000A Wedge compression fracture of unspecified lumbar vertebra, initial encounter for closed fracture: Secondary | ICD-10-CM

## 2014-07-30 DIAGNOSIS — S32000S Wedge compression fracture of unspecified lumbar vertebra, sequela: Secondary | ICD-10-CM | POA: Diagnosis not present

## 2014-07-30 DIAGNOSIS — M25561 Pain in right knee: Secondary | ICD-10-CM

## 2014-07-30 DIAGNOSIS — M25559 Pain in unspecified hip: Secondary | ICD-10-CM

## 2014-07-30 DIAGNOSIS — S42031A Displaced fracture of lateral end of right clavicle, initial encounter for closed fracture: Secondary | ICD-10-CM | POA: Diagnosis present

## 2014-07-30 DIAGNOSIS — S82009A Unspecified fracture of unspecified patella, initial encounter for closed fracture: Secondary | ICD-10-CM

## 2014-07-30 DIAGNOSIS — Z87891 Personal history of nicotine dependence: Secondary | ICD-10-CM | POA: Diagnosis not present

## 2014-07-30 DIAGNOSIS — S32591A Other specified fracture of right pubis, initial encounter for closed fracture: Secondary | ICD-10-CM | POA: Diagnosis present

## 2014-07-30 DIAGNOSIS — M810 Age-related osteoporosis without current pathological fracture: Secondary | ICD-10-CM | POA: Diagnosis present

## 2014-07-30 DIAGNOSIS — R410 Disorientation, unspecified: Secondary | ICD-10-CM | POA: Insufficient documentation

## 2014-07-30 DIAGNOSIS — S32509A Unspecified fracture of unspecified pubis, initial encounter for closed fracture: Secondary | ICD-10-CM | POA: Diagnosis not present

## 2014-07-30 DIAGNOSIS — Z789 Other specified health status: Secondary | ICD-10-CM | POA: Diagnosis not present

## 2014-07-30 DIAGNOSIS — W19XXXA Unspecified fall, initial encounter: Secondary | ICD-10-CM

## 2014-07-30 DIAGNOSIS — Z7982 Long term (current) use of aspirin: Secondary | ICD-10-CM | POA: Diagnosis not present

## 2014-07-30 DIAGNOSIS — R000 Tachycardia, unspecified: Secondary | ICD-10-CM | POA: Diagnosis present

## 2014-07-30 DIAGNOSIS — Z882 Allergy status to sulfonamides status: Secondary | ICD-10-CM | POA: Diagnosis not present

## 2014-07-30 DIAGNOSIS — M2570 Osteophyte, unspecified joint: Secondary | ICD-10-CM | POA: Diagnosis present

## 2014-07-30 DIAGNOSIS — M069 Rheumatoid arthritis, unspecified: Secondary | ICD-10-CM | POA: Diagnosis present

## 2014-07-30 DIAGNOSIS — Z7409 Other reduced mobility: Secondary | ICD-10-CM | POA: Diagnosis present

## 2014-07-30 DIAGNOSIS — N390 Urinary tract infection, site not specified: Secondary | ICD-10-CM | POA: Diagnosis present

## 2014-07-30 DIAGNOSIS — Z515 Encounter for palliative care: Secondary | ICD-10-CM | POA: Insufficient documentation

## 2014-07-30 DIAGNOSIS — Z7951 Long term (current) use of inhaled steroids: Secondary | ICD-10-CM | POA: Diagnosis not present

## 2014-07-30 DIAGNOSIS — R52 Pain, unspecified: Secondary | ICD-10-CM

## 2014-07-30 DIAGNOSIS — R609 Edema, unspecified: Secondary | ICD-10-CM

## 2014-07-30 DIAGNOSIS — S32599A Other specified fracture of unspecified pubis, initial encounter for closed fracture: Secondary | ICD-10-CM | POA: Diagnosis present

## 2014-07-30 DIAGNOSIS — Z66 Do not resuscitate: Secondary | ICD-10-CM | POA: Diagnosis present

## 2014-07-30 DIAGNOSIS — S62308A Unspecified fracture of other metacarpal bone, initial encounter for closed fracture: Secondary | ICD-10-CM | POA: Diagnosis present

## 2014-07-30 DIAGNOSIS — S023XXA Fracture of orbital floor, initial encounter for closed fracture: Secondary | ICD-10-CM | POA: Diagnosis present

## 2014-07-30 DIAGNOSIS — I10 Essential (primary) hypertension: Secondary | ICD-10-CM | POA: Diagnosis present

## 2014-07-30 DIAGNOSIS — S82001A Unspecified fracture of right patella, initial encounter for closed fracture: Secondary | ICD-10-CM | POA: Diagnosis present

## 2014-07-30 DIAGNOSIS — Z90710 Acquired absence of both cervix and uterus: Secondary | ICD-10-CM | POA: Diagnosis not present

## 2014-07-30 DIAGNOSIS — Z681 Body mass index (BMI) 19 or less, adult: Secondary | ICD-10-CM | POA: Diagnosis not present

## 2014-07-30 DIAGNOSIS — S02401A Maxillary fracture, unspecified, initial encounter for closed fracture: Secondary | ICD-10-CM | POA: Diagnosis present

## 2014-07-30 LAB — CBC WITH DIFFERENTIAL/PLATELET
BASOS ABS: 0 10*3/uL (ref 0.0–0.1)
BASOS PCT: 0 % (ref 0–1)
EOS PCT: 0 % (ref 0–5)
Eosinophils Absolute: 0 10*3/uL (ref 0.0–0.7)
HEMATOCRIT: 36.1 % (ref 36.0–46.0)
Hemoglobin: 12.3 g/dL (ref 12.0–15.0)
LYMPHS PCT: 11 % — AB (ref 12–46)
Lymphs Abs: 1.1 10*3/uL (ref 0.7–4.0)
MCH: 33.7 pg (ref 26.0–34.0)
MCHC: 34.1 g/dL (ref 30.0–36.0)
MCV: 98.9 fL (ref 78.0–100.0)
Monocytes Absolute: 0.9 10*3/uL (ref 0.1–1.0)
Monocytes Relative: 9 % (ref 3–12)
Neutro Abs: 7.3 10*3/uL (ref 1.7–7.7)
Neutrophils Relative %: 80 % — ABNORMAL HIGH (ref 43–77)
Platelets: 203 10*3/uL (ref 150–400)
RBC: 3.65 MIL/uL — ABNORMAL LOW (ref 3.87–5.11)
RDW: 12.3 % (ref 11.5–15.5)
WBC: 9.3 10*3/uL (ref 4.0–10.5)

## 2014-07-30 LAB — BASIC METABOLIC PANEL
Anion gap: 10 (ref 5–15)
BUN: 6 mg/dL (ref 6–23)
CO2: 29 mmol/L (ref 19–32)
CREATININE: 0.52 mg/dL (ref 0.50–1.10)
Calcium: 9.2 mg/dL (ref 8.4–10.5)
Chloride: 100 mmol/L (ref 96–112)
GLUCOSE: 111 mg/dL — AB (ref 70–99)
POTASSIUM: 3.6 mmol/L (ref 3.5–5.1)
Sodium: 139 mmol/L (ref 135–145)

## 2014-07-30 MED ORDER — CEPHALEXIN 500 MG PO CAPS
500.0000 mg | ORAL_CAPSULE | Freq: Three times a day (TID) | ORAL | Status: DC
  Administered 2014-07-30 – 2014-08-07 (×23): 500 mg via ORAL
  Filled 2014-07-30 (×26): qty 1

## 2014-07-30 MED ORDER — DONEPEZIL HCL 5 MG PO TABS
5.0000 mg | ORAL_TABLET | Freq: Every day | ORAL | Status: DC
  Administered 2014-07-30 – 2014-08-06 (×8): 5 mg via ORAL
  Filled 2014-07-30 (×9): qty 1

## 2014-07-30 MED ORDER — LORAZEPAM 2 MG/ML IJ SOLN: 0.5000 mg | Freq: Once | INTRAMUSCULAR | Status: DC

## 2014-07-30 MED ORDER — BISACODYL 5 MG PO TBEC
5.0000 mg | DELAYED_RELEASE_TABLET | Freq: Every day | ORAL | Status: DC | PRN
  Administered 2014-08-02: 5 mg via ORAL
  Filled 2014-07-30: qty 1

## 2014-07-30 MED ORDER — FLUTICASONE PROPIONATE 50 MCG/ACT NA SUSP
1.0000 | Freq: Two times a day (BID) | NASAL | Status: DC
  Administered 2014-07-30 – 2014-08-07 (×16): 1 via NASAL
  Filled 2014-07-30: qty 16

## 2014-07-30 MED ORDER — DULOXETINE HCL 60 MG PO CPEP
60.0000 mg | ORAL_CAPSULE | Freq: Every day | ORAL | Status: DC
  Administered 2014-07-31 – 2014-08-07 (×8): 60 mg via ORAL
  Filled 2014-07-30 (×8): qty 1

## 2014-07-30 MED ORDER — DOCUSATE SODIUM 100 MG PO CAPS
200.0000 mg | ORAL_CAPSULE | Freq: Every day | ORAL | Status: DC
  Administered 2014-07-30 – 2014-08-06 (×8): 200 mg via ORAL
  Filled 2014-07-30 (×10): qty 2

## 2014-07-30 MED ORDER — FOLIC ACID 1 MG PO TABS
1.0000 mg | ORAL_TABLET | Freq: Every day | ORAL | Status: DC
Start: 2014-07-30 — End: 2014-08-07
  Administered 2014-07-31 – 2014-08-07 (×8): 1 mg via ORAL
  Filled 2014-07-30 (×8): qty 1

## 2014-07-30 MED ORDER — LEVOTHYROXINE SODIUM 88 MCG PO TABS
88.0000 ug | ORAL_TABLET | Freq: Every day | ORAL | Status: DC
  Administered 2014-07-31 – 2014-08-07 (×8): 88 ug via ORAL
  Filled 2014-07-30 (×10): qty 1

## 2014-07-30 MED ORDER — METHOTREXATE (ANTI-RHEUMATIC) 2.5 MG PO TABS: 7.5000 mg | ORAL_TABLET | ORAL | Status: DC

## 2014-07-30 MED ORDER — MAGNESIUM OXIDE 250 MG PO TABS: 250.0000 mg | ORAL_TABLET | Freq: Every day | ORAL | Status: DC

## 2014-07-30 MED ORDER — ENOXAPARIN SODIUM 40 MG/0.4ML ~~LOC~~ SOLN
40.0000 mg | SUBCUTANEOUS | Status: DC
  Administered 2014-07-30 – 2014-08-06 (×8): 40 mg via SUBCUTANEOUS
  Filled 2014-07-30 (×9): qty 0.4

## 2014-07-30 MED ORDER — ASPIRIN 81 MG PO CHEW
81.0000 mg | CHEWABLE_TABLET | Freq: Every day | ORAL | Status: DC
  Administered 2014-07-31 – 2014-08-07 (×8): 81 mg via ORAL
  Filled 2014-07-30 (×10): qty 1

## 2014-07-30 MED ORDER — MORPHINE SULFATE 2 MG/ML IJ SOLN
1.0000 mg | INTRAMUSCULAR | Status: DC | PRN
  Administered 2014-07-30 – 2014-08-03 (×9): 1 mg via INTRAVENOUS
  Filled 2014-07-30 (×10): qty 1

## 2014-07-30 MED ORDER — MORPHINE SULFATE 4 MG/ML IJ SOLN
4.0000 mg | Freq: Once | INTRAMUSCULAR | Status: AC
  Administered 2014-07-30: 4 mg via INTRAVENOUS
  Filled 2014-07-30: qty 1

## 2014-07-30 MED ORDER — ENSURE ENLIVE PO LIQD
237.0000 mL | Freq: Two times a day (BID) | ORAL | Status: DC
  Administered 2014-07-31 – 2014-08-07 (×11): 237 mL via ORAL

## 2014-07-30 MED ORDER — MAGNESIUM OXIDE 400 (241.3 MG) MG PO TABS
200.0000 mg | ORAL_TABLET | Freq: Every day | ORAL | Status: DC
  Administered 2014-07-31 – 2014-08-07 (×8): 200 mg via ORAL
  Filled 2014-07-30 (×8): qty 0.5

## 2014-07-30 MED ORDER — OXYCODONE-ACETAMINOPHEN 5-325 MG PO TABS
1.0000 | ORAL_TABLET | Freq: Once | ORAL | Status: AC
  Administered 2014-07-30: 1 via ORAL
  Filled 2014-07-30: qty 1

## 2014-07-30 MED ORDER — ONDANSETRON HCL 4 MG PO TABS: 4.0000 mg | ORAL_TABLET | Freq: Four times a day (QID) | ORAL | Status: DC | PRN

## 2014-07-30 MED ORDER — ONDANSETRON HCL 4 MG/2ML IJ SOLN: 4.0000 mg | Freq: Four times a day (QID) | INTRAMUSCULAR | Status: DC | PRN

## 2014-07-30 MED ORDER — OXYCODONE-ACETAMINOPHEN 5-325 MG PO TABS
1.0000 | ORAL_TABLET | ORAL | Status: DC | PRN
  Administered 2014-07-30 – 2014-08-03 (×9): 2 via ORAL
  Filled 2014-07-30 (×9): qty 2

## 2014-07-30 MED ORDER — DICLOFENAC EPOLAMINE 1.3 % TD PTCH
1.0000 | MEDICATED_PATCH | Freq: Two times a day (BID) | TRANSDERMAL | Status: DC
  Administered 2014-07-30 – 2014-08-05 (×12): 1 via TRANSDERMAL
  Filled 2014-07-30 (×16): qty 1

## 2014-07-30 MED ORDER — FENTANYL CITRATE (PF) 100 MCG/2ML IJ SOLN
50.0000 ug | INTRAMUSCULAR | Status: DC | PRN
Start: 2014-07-30 — End: 2014-08-03
  Administered 2014-07-30 – 2014-08-02 (×9): 50 ug via INTRAVENOUS
  Filled 2014-07-30 (×9): qty 2

## 2014-07-30 NOTE — ED Notes (Signed)
Per EMS: Pt seen here Monday for fall. Pt from [facility] assisted living. Pt complaining of increased pain today. Unable to straighten R leg, some baseline dementia, but increased with opiate pain management. Obvious reddening to R knee. Pt unwilling to allow RN to touch knee.

## 2014-07-30 NOTE — ED Provider Notes (Signed)
CSN: [id number]     Arrival date & time 07/30/14  1032 History   First MD Initiated Contact with Patient 07/30/14 1040     Chief Complaint  Patient presents with  . Fall     (Consider location/radiation/quality/duration/timing/severity/associated sxs/prior Treatment) The history is provided by the patient, the spouse and medical records.    Pt with hx dementia seen in ED 3 days ago after fall from shower chair while attendant was distracted.  Pt fell onto right side, sustained multiple facial fractures, right clavicular and right 5th metacarpal fractures.  Was d/c home with change of home norco from Q 8 hrs to Q 6 hours.  Her pain has been uncontrolled and she is having difficulty getting around as she is complaining of right knee and right foot pain.  At baseline she walks with a walker.  Was sent by [facility] as she is only in assisted living and is not able to get around normally and has uncontrolled pain.   Past Medical History  Diagnosis Date  . Osteoporosis   . Hypothyroid   . Depression   . Rheumatoid arthritis(714.0)   . DDD (degenerative disc disease)     bone spurs in spine also   Past Surgical History  Procedure Laterality Date  . Tonsillectomy    . Wisdom tooth extraction    . Abdominal hysterectomy      also removed left ovary  . Abdominal hysterectomy    . Orif humerus fracture  08/22/2011    Procedure: OPEN REDUCTION INTERNAL FIXATION (ORIF) HUMERAL SHAFT FRACTURE;  Surgeon: Kathryne Hitch, MD;  Location: [facility];  Service: Orthopedics;  Laterality: Left;  Open Reduction Internal Fixation Left Humeral Shaft Fracture   Family History  Problem Relation Age of Onset  . Heart attack Mother   . Cancer Father   . Heart attack Brother   . Cancer Other   . Heart attack Other    History  Substance Use Topics  . Smoking status: Former Smoker -- 2.00 packs/day    Types: Cigarettes    Quit date: 05/15/1991  . Smokeless tobacco: Never  Used  . Alcohol Use: Yes     Comment: sober for 30 years  history of alcoholism   OB History    No data available     Review of Systems  Unable to perform ROS: Dementia      Allergies  Sulfa antibiotics  Home Medications   Prior to Admission medications   Medication Sig Start Date End Date Taking? Authorizing Provider  acetaminophen (TYLENOL) 500 MG tablet Take 1,000 mg by mouth 2 (two) times daily.    Historical Provider, MD  aspirin 81 MG tablet Take 81 mg by mouth daily.     Historical Provider, MD  calcium citrate-vitamin D 200-200 MG-UNIT TABS Take 1 tablet by mouth daily.    Historical Provider, MD  cephALEXin (KEFLEX) 500 MG capsule Take 1 capsule (500 mg total) by mouth 3 (three) times daily. 07/27/14   Eyvonne Mechanic, PA-C  cholecalciferol (VITAMIN D) 1000 UNITS tablet Take 2,000 Units by mouth daily.    Historical Provider, MD  Cyanocobalamin (VITAMIN B 12 PO) Take 1 tablet by mouth daily.    Historical Provider, MD  diclofenac (FLECTOR) 1.3 % PTCH Place 1 patch onto the skin 2 (two) times daily.    Historical Provider, MD  docusate sodium (COLACE) 100 MG capsule Take 200 mg by mouth at bedtime.    Historical Provider, MD  donepezil (ARICEPT) 5 MG tablet Take 5 mg by mouth at bedtime.    Historical Provider, MD  DULoxetine (CYMBALTA) 60 MG capsule Take 60 mg by mouth daily.    Historical Provider, MD  etanercept (ENBREL) 50 MG/ML injection Inject 50 mg into the skin once a week. On tuesdays.    Historical Provider, MD  fluticasone (VERAMYST) 27.5 MCG/SPRAY nasal spray Place 2 sprays into the nose daily as needed. For stuffy nose    Historical Provider, MD  folic acid (FOLVITE) 1 MG tablet Take 1 mg by mouth daily.    Historical Provider, MD  HYDROcodone-acetaminophen (NORCO/VICODIN) 5-325 MG per tablet Take 1 tablet by mouth every 6 (six) hours as needed. 07/27/14   Harle Battiest, NP  levothyroxine (SYNTHROID, LEVOTHROID) 100 MCG tablet Take 100 mcg by mouth daily  before breakfast.    Historical Provider, MD  levothyroxine (SYNTHROID, LEVOTHROID) 88 MCG tablet Take 88 mcg by mouth daily before breakfast.    Historical Provider, MD  magnesium oxide (MAG-OX) 400 MG tablet Take 400 mg by mouth daily.    Historical Provider, MD  methotrexate (RHEUMATREX) 2.5 MG tablet Take 7.5 mg by mouth 2 (two) times daily. On Mondays. 3 tablets at lunch and 3 tablets at dinner    Historical Provider, MD  prenatal vitamin w/FE, FA (PRENATAL 1 + 1) 27-1 MG TABS Take 1 tablet by mouth daily.    Historical Provider, MD   BP 142/54 mmHg  Pulse 92  Temp(Src) 98.1 F (36.7 C) (Oral)  Resp 16  SpO2 99% Physical Exam  Constitutional: She appears well-developed and well-nourished. No distress.  HENT:  Head: Normocephalic.  Extensive ecchymosis over right facial area and right anterior neck.  Pt is able to move air through her nose.    Neck: Neck supple.  Cardiovascular: Normal rate and regular rhythm.   Pulmonary/Chest: Effort normal and breath sounds normal. She exhibits no tenderness.  Musculoskeletal:       Right hip: She exhibits normal range of motion, normal strength and no crepitus.       Right knee: She exhibits swelling and ecchymosis. She exhibits no deformity, no laceration and normal alignment. Tenderness found.       Legs: Spine nontender, no crepitus, or stepoffs.   Pt hold both legs in the air without difficulty.  Sensation intact.  Distal pulses intact.  Mild diffuse tenderness of right foot.  Right knee mildly edematous, tender to palpation.  Right hip with resistance to passive ROM but without pain.   No right ankle tenderness.  Left leg with mild erythema/healing ecchymosis over left patella without tenderness.    Right arm and sling and right short arm splint.  Pt moves fingers of right hand without difficulty, sensation intact.   Neurological: She is alert.  Skin: She is not diaphoretic.  Nursing note and vitals reviewed.   ED Course  Procedures  (including critical care time) Labs Review Labs Reviewed  BASIC METABOLIC PANEL - Abnormal; Notable for the following:    Glucose, Bld 111 (*)    All other components within normal limits  CBC WITH DIFFERENTIAL/PLATELET - Abnormal; Notable for the following:    RBC 3.65 (*)    Neutrophils Relative % 80 (*)    Lymphocytes Relative 11 (*)    All other components within normal limits    Imaging Review Dg Knee 1-2 Views Right  07/30/2014   CLINICAL DATA:  Acute right knee pain after fall. Initial encounter.  EXAM: RIGHT KNEE -  1-2 VIEW  COMPARISON:  None available currently.  FINDINGS: There is no evidence of fracture, dislocation, or joint effusion. There is no evidence of arthropathy or other focal bone abnormality. Soft tissues are unremarkable.  IMPRESSION: No significant abnormality seen in the right knee.   Electronically Signed   By: Lupita Raider, M.D.   On: 07/30/2014 13:29   Dg Foot Complete Right  07/30/2014   CLINICAL DATA:  Multiple falls with right foot pain.  EXAM: RIGHT FOOT COMPLETE - 3+ VIEW  COMPARISON:  None.  FINDINGS: Mild diffuse decreased bone mineralization. Persistent flexion at the second proximal interphalangeal joint. No definite acute fracture or dislocation.  IMPRESSION: No acute findings.   Electronically Signed   By: Elberta Fortis M.D.   On: 07/30/2014 13:31   Dg Hip Unilat With Pelvis 2-3 Views Right  07/30/2014   CLINICAL DATA:  Multiple falls. Severe pain. Patient unable to straighten RIGHT leg.  EXAM: RIGHT HIP (WITH PELVIS) 2-3 VIEWS  COMPARISON:  08/20/2011.  FINDINGS: There is no evidence of hip fracture or dislocation. There is no evidence of erosive arthropathy. Moderate callus surrounding RIGHT superior and inferior pubic ramus fractures.  IMPRESSION: Negative for acute abnormality.   Electronically Signed   By: Davonna Belling M.D.   On: 07/30/2014 13:35     EKG Interpretation None       11:24 AM Social work to see patient.   11:46 AM Dr Effie Shy  made aware of patient.    Discussed pt with Dr Effie Shy.    MDM   Final diagnoses:  Fall  Right knee pain  Multiple fractures   Afebrile nontoxic patient with fall 3 days ago that caused her to sustain multiple facial fractures, right clavicle and right hand fractures, d/c back to her facility with minimal change to her chronic pain medication regimen returns to ED with uncontrolled pain, difficulty ambulating, new complaint of right knee pain.  Xrays of RLE are negative.  Attempted pain control with PO percocet.  After 2 percocets patient continued to be in significant amount of pain.  IV established and IV pain medication given.  Pt admitted to [facility] for pain control.  Her facility has multiple levels of care and are willing to help her in any one of those once her pain is well controlled.     Trixie Dredge, PA-C 07/30/14 1606  Mancel Bale, MD 07/30/14 [phone]

## 2014-07-30 NOTE — ED Provider Notes (Signed)
  Face-to-face evaluation   History: Alert, elderly female who sustained injuries in a fall several days ago. Her pain has not been controlled. She's been unable to participate in physical therapy.  Physical exam: Elderly female, who has dysarthria which is chronic. Splint on right forearm. Bruising right face. Right knee is held flexed. She has tenderness and erythema anteriorly. There is no deformity.  Medical screening examination/treatment/procedure(s) were conducted as a shared visit with non-physician practitioner(s) and myself.  I personally evaluated the patient during the encounter  Mancel Bale, MD 07/30/14 1719

## 2014-07-30 NOTE — H&P (Signed)
[facility] History and Physical  [name] BWG:[id number] DOB: 1946-02-19 DOA: 07/30/2014  Referring physician: EDP PCP: Juline Patch, MD   Chief Complaint: worsening knee pain.  HPI: [name] is a 69 y.o. female with prior h/o hypothyroidism, RA, had a mechanical fall to the right side on 4.18 and ended up having right superior and inferior pubic ramus fractures, and fractures of the anterior, medial and posterior lateral walls of the right maxillary sinus and of the right zygomatic arch and the lateral wall of the right orbit. She was started on oral hydrocodone and was seen by ENT on 4/18. She was also started on keflex for maxillary sinus wall fractures. But she reported persistent and had to be transferred to ED for pain control. In  ED she was given one percocet and referred tom edical service for admission for pain control. On arrival to ED, she was in moderate distress from knee pain, tachycardic and hypertensive. Labs were unremarkable. She will be admitted to m ed surg for pain control.   Review of Systems:  Confused and slurred speech, most of the history obtained from the husband at bedside.   Past Medical History  Diagnosis Date  . Osteoporosis   . Hypothyroid   . Depression   . Rheumatoid arthritis(714.0)   . DDD (degenerative disc disease)     bone spurs in spine also   Past Surgical History  Procedure Laterality Date  . Tonsillectomy    . Wisdom tooth extraction    . Abdominal hysterectomy      also removed left ovary  . Abdominal hysterectomy    . Orif humerus fracture  08/22/2011    Procedure: OPEN REDUCTION INTERNAL FIXATION (ORIF) HUMERAL SHAFT FRACTURE;  Surgeon: Kathryne Hitch, MD;  Location: [facility];  Service: Orthopedics;  Laterality: Left;  Open Reduction Internal Fixation Left Humeral Shaft Fracture   Social History:  reports that she quit smoking about 23 years ago. Her smoking use included Cigarettes. She smoked 2.00 packs per day.  She has never used smokeless tobacco. She reports that she drinks alcohol. She reports that she does not use illicit drugs.  Allergies  Allergen Reactions  . Sulfa Antibiotics Nausea Only    Family History  Problem Relation Age of Onset  . Heart attack Mother   . Cancer Father   . Heart attack Brother   . Cancer Other   . Heart attack Other     Prior to Admission medications   Medication Sig Start Date End Date Taking? Authorizing Provider  acetaminophen (TYLENOL) 500 MG tablet Take 1,000 mg by mouth at bedtime.    Yes Historical Provider, MD  aspirin 81 MG chewable tablet Chew 81 mg by mouth daily.   Yes Historical Provider, MD  Calcium Carb-Cholecalciferol 600-200 MG-UNIT TABS Take 1 tablet by mouth daily.   Yes Historical Provider, MD  Calcium Carbonate 500 MG CHEW Chew 1 tablet by mouth daily as needed (heartburn).   Yes Historical Provider, MD  cephALEXin (KEFLEX) 500 MG capsule Take 1 capsule (500 mg total) by mouth 3 (three) times daily. 07/27/14  Yes Jeffrey Hedges, PA-C  cholecalciferol (VITAMIN D) 1000 UNITS tablet Take 2,000 Units by mouth daily.   Yes Historical Provider, MD  Cyanocobalamin (VITAMIN B 12 PO) Take 1 tablet by mouth daily.   Yes Historical Provider, MD  diclofenac (FLECTOR) 1.3 % PTCH Place 1 patch onto the skin 2 (two) times daily.   Yes Historical Provider, MD  docusate sodium (  COLACE) 100 MG capsule Take 200 mg by mouth at bedtime.   Yes Historical Provider, MD  donepezil (ARICEPT) 5 MG tablet Take 5 mg by mouth at bedtime.   Yes Historical Provider, MD  DULoxetine (CYMBALTA) 60 MG capsule Take 60 mg by mouth daily.   Yes Historical Provider, MD  etanercept (ENBREL) 50 MG/ML injection Inject 50 mg into the skin once a week. On tuesdays.   Yes Historical Provider, MD  fluticasone (FLONASE) 50 MCG/ACT nasal spray Place 1 spray into both nostrils 2 (two) times daily.   Yes Historical Provider, MD  folic acid (FOLVITE) 1 MG tablet Take 1 mg by mouth daily.    Yes Historical Provider, MD  HYDROcodone-acetaminophen (NORCO/VICODIN) 5-325 MG per tablet Take 1 tablet by mouth every 6 (six) hours as needed. Patient taking differently: Take 1 tablet by mouth every 6 (six) hours as needed for moderate pain.  07/27/14  Yes Harle Battiest, NP  levothyroxine (SYNTHROID, LEVOTHROID) 88 MCG tablet Take 88 mcg by mouth daily before breakfast.   Yes Historical Provider, MD  Magnesium Oxide 250 MG TABS Take 250 mg by mouth daily.   Yes Historical Provider, MD  methotrexate (RHEUMATREX) 2.5 MG tablet Take 7.5 mg by mouth once a week. Take on Mondays   Yes Historical Provider, MD  prenatal vitamin w/FE, FA (PRENATAL 1 + 1) 27-1 MG TABS Take 1 tablet by mouth daily.   Yes Historical Provider, MD  magnesium oxide (MAG-OX) 400 MG tablet Take 400 mg by mouth daily.    Historical Provider, MD   Physical Exam: Filed Vitals:   07/30/14 1130 07/30/14 1215 07/30/14 1300 07/30/14 1331  BP: 149/77 153/62 132/56 162/67  Pulse: 102 78 78 101  Temp:      TempSrc:      Resp:    20  SpO2: 100% 98% 98% 99%    Wt Readings from Last 3 Encounters:  08/21/11 61.236 kg (135 lb)    General:  Appears to be in distress from pain.  Eyes: PERRL, normal lids, irises & conjunctiva Neck: no LAD, masses or thyromegaly Cardiovascular: tachycaridc. No LE edema. Respiratory: CTA bilaterally, no w/r/r. Normal respiratory effort. Abdomen: soft, ntnd Skin: no rash or induration seen on limited exam Musculoskeletal: right knee erythematous, swollen and moderate tenderness on palpation and movement. Neurologic: appears to be non focal.  Slurred speech ( as per husband at bedside, she had slurred speech since 3 years). Able t omove all extremities, confused as she received some ativan prior to my exam .           Labs on Admission:  Basic Metabolic Panel:  Recent Labs Lab 07/27/14 1140 07/30/14 1149  NA 139 139  K 3.7 3.6  CL 97 100  CO2  --  29  GLUCOSE 114* 111*  BUN 6 6    CREATININE 0.60 0.52  CALCIUM  --  9.2   Liver Function Tests: No results for input(s): AST, ALT, ALKPHOS, BILITOT, PROT, ALBUMIN in the last 168 hours. No results for input(s): LIPASE, AMYLASE in the last 168 hours. No results for input(s): AMMONIA in the last 168 hours. CBC:  Recent Labs Lab 07/27/14 1128 07/27/14 1140 07/30/14 1149  WBC 8.6  --  9.3  NEUTROABS 6.9  --  7.3  HGB 13.5 14.6 12.3  HCT 40.2 43.0 36.1  MCV 100.5*  --  98.9  PLT 170  --  203   Cardiac Enzymes: No results for input(s): CKTOTAL, CKMB, CKMBINDEX, TROPONINI  in the last 168 hours.  BNP (last 3 results) No results for input(s): BNP in the last 8760 hours.  ProBNP (last 3 results) No results for input(s): PROBNP in the last 8760 hours.  CBG: No results for input(s): GLUCAP in the last 168 hours.  Radiological Exams on Admission: Dg Knee 1-2 Views Right  07/30/2014   CLINICAL DATA:  Acute right knee pain after fall. Initial encounter.  EXAM: RIGHT KNEE - 1-2 VIEW  COMPARISON:  None available currently.  FINDINGS: There is no evidence of fracture, dislocation, or joint effusion. There is no evidence of arthropathy or other focal bone abnormality. Soft tissues are unremarkable.  IMPRESSION: No significant abnormality seen in the right knee.   Electronically Signed   By: Lupita Raider, M.D.   On: 07/30/2014 13:29   Dg Foot Complete Right  07/30/2014   CLINICAL DATA:  Multiple falls with right foot pain.  EXAM: RIGHT FOOT COMPLETE - 3+ VIEW  COMPARISON:  None.  FINDINGS: Mild diffuse decreased bone mineralization. Persistent flexion at the second proximal interphalangeal joint. No definite acute fracture or dislocation.  IMPRESSION: No acute findings.   Electronically Signed   By: Elberta Fortis M.D.   On: 07/30/2014 13:31   Dg Hip Unilat With Pelvis 2-3 Views Right  07/30/2014   CLINICAL DATA:  Multiple falls. Severe pain. Patient unable to straighten RIGHT leg.  EXAM: RIGHT HIP (WITH PELVIS) 2-3 VIEWS   COMPARISON:  08/20/2011.  FINDINGS: There is no evidence of hip fracture or dislocation. There is no evidence of erosive arthropathy. Moderate callus surrounding RIGHT superior and inferior pubic ramus fractures.  IMPRESSION: Negative for acute abnormality.   Electronically Signed   By: Davonna Belling M.D.   On: 07/30/2014 13:35    EKG: pending.   Assessment/Plan Active Problems:   Pain   Right knee pain  Mechanical fall to the right side with subsequent maxillary sinus wall fractures and pubic ramus fractures/ right clavicular fracture: Admitted to med surg bed. And started her on IV fluids and IV pain control. Stool softeners daily. Resume keflex to complete the course as per recommendations by ENT.    Right knee pain: Tender and erythematous. Get MRI of the right knee for further evaluation.    Hypothyroidism: Resume synthroid.   Rheumatoid arthritis: Resume methotrexate.   Osteoporosis:  Resume calcium supplements.    Code Status: full code DVT Prophylaxis: Family Communication: husband at bedside Disposition Plan: admit to medsurg  Time spent: 55 minutes.   [facility] Pager [phone]

## 2014-07-30 NOTE — ED Notes (Signed)
Ordered diet tray 

## 2014-07-31 ENCOUNTER — Observation Stay (HOSPITAL_COMMUNITY): Payer: Medicare PPO

## 2014-07-31 DIAGNOSIS — S32509A Unspecified fracture of unspecified pubis, initial encounter for closed fracture: Secondary | ICD-10-CM

## 2014-07-31 DIAGNOSIS — E039 Hypothyroidism, unspecified: Secondary | ICD-10-CM | POA: Diagnosis present

## 2014-07-31 DIAGNOSIS — S32599A Other specified fracture of unspecified pubis, initial encounter for closed fracture: Secondary | ICD-10-CM | POA: Diagnosis present

## 2014-07-31 MED ORDER — POLYETHYLENE GLYCOL 3350 17 G PO PACK
17.0000 g | PACK | Freq: Every day | ORAL | Status: DC | PRN
  Administered 2014-08-02: 17 g via ORAL
  Filled 2014-07-31 (×3): qty 1

## 2014-07-31 MED ORDER — CETYLPYRIDINIUM CHLORIDE 0.05 % MT LIQD
7.0000 mL | Freq: Two times a day (BID) | OROMUCOSAL | Status: DC
  Administered 2014-07-31 – 2014-08-07 (×13): 7 mL via OROMUCOSAL

## 2014-07-31 NOTE — Progress Notes (Signed)
Pt will be returning back to room because they were unable to complete MRI.  Per radiologist, she was unable to keep knee straight and was crying out in pain even after giving prn fentanyl prior to going to procedure.  Will continue to monitor.

## 2014-07-31 NOTE — Progress Notes (Signed)
[facility]                                                                              Patient Demographics  [name], is a 69 y.o. female, DOB - 1946/01/12, YQI:[id number]  Admit date - 07/30/2014   Admitting Physician Kathlen Mody, MD  Outpatient Primary MD for the patient is Juline Patch, MD  LOS - 1   Chief Complaint  Patient presents with  . Fall       Brief HPI   [name] is a 69 y.o. female with prior h/o hypothyroidism, RA, had a mechanical fall to the right side on 4.18 and ended up having right superior and inferior pubic ramus fractures, and fractures of the anterior, medial and posterior lateral walls of the right maxillary sinus and of the right zygomatic arch and the lateral wall of the right orbit. She was started on oral hydrocodone and was seen by ENT on 4/18. She was also started on keflex for maxillary sinus wall fractures. But she reported persistent and had to be transferred to ED for pain control. In ED she was given one percocet and referred tom edical service for admission for pain control. On arrival to ED, she was in moderate distress from knee pain, tachycardic and hypertensive. Labs were unremarkable. She will be admitted to m ed surg for pain control.    Assessment & Plan    Principal Problem:   Right knee pain; unclear etiology, patient had a mechanical fall on the right side on 4/18 had had several fractures including right superior and inferior pubic rami fracture and right maxillary sinus fracture, right orbital fracture - X-ray of the right knee did not show any fracture or dislocation - Patient's exam is very limited, is tender on the right knee however patient did not allow me to fully examine the right knee due to pain - MRI of the right knee was ordered however patient was not able to complete it due to pain - Orthopedics consult called, and discussed with Dr. Eulah Pont, Will evaluate patient for possible steroid  injection today, may need arthrocentesis to rule out any infection - Continue pain control - Once pain is controlled, hopefully will start physical therapy   Active Problems:   Hypothyroid Continue Synthroid    Fracture of multiple pubic rami - Nonoperative, PTOT   Depression with dementia - Continue Cymbalta, Aricept  Recent fracture of inferior, medial, posterior lateral walls of right maxilla sinus, right zygomatic arch, lateral wall of the right orbit -Continue Keflex as recommended by ENT    right distal clavicular fracture, subtle nondisplaced fracture of fifth metacarpal distally - Continue shoulder immobilizer for comfort, wrist splint   Code Status: Full code  Family Communication: Discussed in detail with the patient, all imaging results, lab results explained to the patient and husband in detail    Disposition Plan: Currently at [facility] assisted [facility] facility, may need skilled nursing facility  Time Spent in minutes  25 minutes  Procedures  None   Consults   Orthopedics  DVT Prophylaxis   Lovenox   Medications  Scheduled Meds: . aspirin  81 mg Oral Daily  . cephALEXin  500 mg Oral TID  . diclofenac  1 patch Transdermal BID  . docusate sodium  200 mg Oral QHS  . donepezil  5 mg Oral QHS  . DULoxetine  60 mg Oral Daily  . enoxaparin (LOVENOX) injection  40 mg Subcutaneous Q24H  . feeding supplement (ENSURE ENLIVE)  237 mL Oral BID BM  . fluticasone  1 spray Each Nare BID  . folic acid  1 mg Oral Daily  . levothyroxine  88 mcg Oral QAC breakfast  . magnesium oxide  200 mg Oral Daily   Continuous Infusions:  PRN Meds:.bisacodyl, fentaNYL (SUBLIMAZE) injection, morphine injection, ondansetron **OR** ondansetron (ZOFRAN) IV, oxyCODONE-acetaminophen, polyethylene glycol   Antibiotics   Anti-infectives    Start     Dose/Rate Route Frequency Ordered Stop   07/30/14 1600  cephALEXin (KEFLEX) capsule 500 mg     500 mg Oral 3 times daily 07/30/14  1523          Subjective:   [name] was seen and examined today. Patient denies dizziness, chest pain, shortness of breath, abdominal pain, N/V/D/C, new weakness, numbess, tingling.  In considerable pain in the right knee, morning and frustrated, unable to lift it due to pain   Objective:   Blood pressure 155/60, pulse 80, temperature 98.1 F (36.7 C), temperature source Oral, resp. rate 16, SpO2 99 %.  Wt Readings from Last 3 Encounters:  08/21/11 61.236 kg (135 lb)     Intake/Output Summary (Last 24 hours) at 07/31/14 1229 Last data filed at 07/31/14 0721  Gross per 24 hour  Intake    200 ml  Output    625 ml  Net   -425 ml    Exam  General: Alert and oriented x 3,  Uncomfortable  HEENT:  PERRLA, EOMI, Anicteic Sclera, mucous membranes moist.   Neck: Supple, no JVD, no masses  CVS: S1 S2 auscultated, no rubs, murmurs or gallops. Regular rate and rhythm.  Respiratory: Clear to auscultation bilaterally, no wheezing, rales or rhonchi  Abdomen: Soft, nontender, nondistended, + bowel sounds  Ext: no cyanosis clubbing or edema, keeps right knee bent, patient declined examination, tender   Neuro: AAOx3, Cr N's II- XII, unable to examine lower extremities due to lack of patient cooperation and pain  skin: No rashes  Psych: Normal affect and demeanor, alert and oriented x3    Data Review   Micro Results No results found for this or any previous visit (from the past 240 hour(s)).  Radiology Reports Dg Shoulder Right  07/27/2014   CLINICAL DATA:  Larey Seat this morning, bruising and pain RIGHT shoulder  EXAM: RIGHT SHOULDER - 2+ VIEW  COMPARISON:  None  FINDINGS: Diffuse osseous demineralization.  AC joint alignment normal.  Slight irregularity of the distal clavicle question age-indeterminate distal RIGHT clavicular fracture.  No additional fracture, dislocation or bone destruction.  Visualized RIGHT ribs intact.  IMPRESSION: Osseous demineralization with  questionable age-indeterminate distal RIGHT clavicular fracture.  No additional focal bony abnormality.   Electronically Signed   By: Ulyses Southward M.D.   On: 07/27/2014 13:29   Dg Knee 1-2 Views Right  07/30/2014   CLINICAL DATA:  Acute right knee pain after fall. Initial encounter.  EXAM: RIGHT KNEE - 1-2 VIEW  COMPARISON:  None available currently.  FINDINGS: There is no evidence of fracture, dislocation, or joint effusion. There is no evidence of arthropathy or other focal bone abnormality. Soft tissues are unremarkable.  IMPRESSION:  No significant abnormality seen in the right knee.   Electronically Signed   By: Lupita Raider, M.D.   On: 07/30/2014 13:29   Ct Head Wo Contrast  07/27/2014   CLINICAL DATA:  Fall. Laceration right-sided forehead. Bruising to the right eye. Epistaxis. Initial encounter. Personal history of dementia.  EXAM: CT HEAD WITHOUT CONTRAST  CT CERVICAL SPINE WITHOUT CONTRAST  TECHNIQUE: Multidetector CT imaging of the head and cervical spine was performed following the standard protocol without intravenous contrast. Multiplanar CT image reconstructions of the cervical spine were also generated.  COMPARISON:  MRI brain 04/19/2014  FINDINGS: CT HEAD FINDINGS  There is marked cerebral atrophy particularly involving the parietal and temporal lobes bilaterally. The ventricular system is dilated proportionate to the degree of atrophy. Moderate white matter changes are again seen bilaterally.  No acute infarct, hemorrhage, or mass lesion is present.  A posterolateral right maxillary sinus fracture is present. There is a comminuted fracture involving the anterior wall of the right maxillary sinus is well. An orbital floor fracture is suspected. The fracture extends superiorly to involve the lateral wall of the right orbit. The globe is intact. There is a segmental fracture of the right zygomatic arch.  Limited supraorbital soft tissue swelling is present without additional fractures.  CT  CERVICAL SPINE FINDINGS  Cervical spine is imaged from the skullbase through T1-2. A hard collar is in place. Multilevel degenerative changes are present. Uncovertebral spurring is worse on the left at C3-4, C4-5, and C5-6. Left-sided osseous foraminal narrowing is present at these levels. No acute fracture or traumatic subluxation is present.  IMPRESSION: 1. Stable atrophy and white matter disease. The atrophy pattern is most prominent in the parietal and temporal lobes consistent with an Alzheimer's type pattern. 2. No acute intracranial abnormality. 3. Comminuted right maxillary sinus fracture with the blood layer within the sinus. 4. Suspect right orbital floor fracture. CT of the maxillofacial sinuses would be useful for further evaluation of this type fracture. 5. Comminuted right lateral orbital wall fracture and segmental right zygomatic arch fracture. 6. Degenerative changes of the cervical spine without evidence for acute trauma.   Electronically Signed   By: Marin Roberts M.D.   On: 07/27/2014 12:59   Ct Cervical Spine Wo Contrast  07/27/2014   CLINICAL DATA:  Fall. Laceration right-sided forehead. Bruising to the right eye. Epistaxis. Initial encounter. Personal history of dementia.  EXAM: CT HEAD WITHOUT CONTRAST  CT CERVICAL SPINE WITHOUT CONTRAST  TECHNIQUE: Multidetector CT imaging of the head and cervical spine was performed following the standard protocol without intravenous contrast. Multiplanar CT image reconstructions of the cervical spine were also generated.  COMPARISON:  MRI brain 04/19/2014  FINDINGS: CT HEAD FINDINGS  There is marked cerebral atrophy particularly involving the parietal and temporal lobes bilaterally. The ventricular system is dilated proportionate to the degree of atrophy. Moderate white matter changes are again seen bilaterally.  No acute infarct, hemorrhage, or mass lesion is present.  A posterolateral right maxillary sinus fracture is present. There is a  comminuted fracture involving the anterior wall of the right maxillary sinus is well. An orbital floor fracture is suspected. The fracture extends superiorly to involve the lateral wall of the right orbit. The globe is intact. There is a segmental fracture of the right zygomatic arch.  Limited supraorbital soft tissue swelling is present without additional fractures.  CT CERVICAL SPINE FINDINGS  Cervical spine is imaged from the skullbase through T1-2. A hard collar is in  place. Multilevel degenerative changes are present. Uncovertebral spurring is worse on the left at C3-4, C4-5, and C5-6. Left-sided osseous foraminal narrowing is present at these levels. No acute fracture or traumatic subluxation is present.  IMPRESSION: 1. Stable atrophy and white matter disease. The atrophy pattern is most prominent in the parietal and temporal lobes consistent with an Alzheimer's type pattern. 2. No acute intracranial abnormality. 3. Comminuted right maxillary sinus fracture with the blood layer within the sinus. 4. Suspect right orbital floor fracture. CT of the maxillofacial sinuses would be useful for further evaluation of this type fracture. 5. Comminuted right lateral orbital wall fracture and segmental right zygomatic arch fracture. 6. Degenerative changes of the cervical spine without evidence for acute trauma.   Electronically Signed   By: Marin Roberts M.D.   On: 07/27/2014 12:59   Dg Hand Complete Right  07/27/2014   CLINICAL DATA:  Right hand pain post fall this morning  EXAM: RIGHT HAND - COMPLETE 3+ VIEW  COMPARISON:  07/20/2013  FINDINGS: Three views of the right hand submitted. No displaced fracture or subluxation. There is cortical irregularity distal aspect of fifth metacarpal suspicious for subtle nondisplaced fracture. Clinical correlation is necessary.  IMPRESSION: No displaced fracture or subluxation. There is cortical irregularity distal aspect of fifth metacarpal suspicious for subtle  nondisplaced fracture. Clinical correlation is necessary.   Electronically Signed   By: Natasha Mead M.D.   On: 07/27/2014 13:12   Dg Foot Complete Right  07/30/2014   CLINICAL DATA:  Multiple falls with right foot pain.  EXAM: RIGHT FOOT COMPLETE - 3+ VIEW  COMPARISON:  None.  FINDINGS: Mild diffuse decreased bone mineralization. Persistent flexion at the second proximal interphalangeal joint. No definite acute fracture or dislocation.  IMPRESSION: No acute findings.   Electronically Signed   By: Elberta Fortis M.D.   On: 07/30/2014 13:31   Dg Hip Unilat With Pelvis 2-3 Views Right  07/30/2014   CLINICAL DATA:  Multiple falls. Severe pain. Patient unable to straighten RIGHT leg.  EXAM: RIGHT HIP (WITH PELVIS) 2-3 VIEWS  COMPARISON:  08/20/2011.  FINDINGS: There is no evidence of hip fracture or dislocation. There is no evidence of erosive arthropathy. Moderate callus surrounding RIGHT superior and inferior pubic ramus fractures.  IMPRESSION: Negative for acute abnormality.   Electronically Signed   By: Davonna Belling M.D.   On: 07/30/2014 13:35   Ct Maxillofacial Wo Cm  07/27/2014   CLINICAL DATA:  The patient fell and has right facial trauma with a laceration and facial fractures.  EXAM: CT MAXILLOFACIAL WITHOUT CONTRAST  TECHNIQUE: Multidetector CT imaging of the maxillofacial structures was performed. Multiplanar CT image reconstructions were also generated. A small metallic BB was placed on the right temple in order to reliably differentiate right from left.  COMPARISON:  None.  FINDINGS: There is a tripod fracture of the right facial bones with a slightly displaced fracture of the posterior aspect of the right zygomatic arch, anterior and lateral wall maxillary sinus fractures, nondisplaced hairline fractures through the medial wall of the right maxillary sinus, and minimally depressed fracture of the floor of the right orbit. The right inferior orbital rim appears to be intact. There is no entrapment of  extraocular muscles of the right orbit. There is an blood fluid level in the right maxillary sinus.  Fracture of the lateral wall of the right orbit.  The other facial bones are normal.  Note is made of severe temporal lobe atrophy.  IMPRESSION: Fractures  of the anterior, medial, and posterior lateral walls of the right maxillary sinus as well as of the right zygomatic arch and of the lateral wall of the right orbit. No muscle entrapment.   Electronically Signed   By: Francene Boyers M.D.   On: 07/27/2014 14:42    CBC  Recent Labs Lab 07/27/14 1128 07/27/14 1140 07/30/14 1149  WBC 8.6  --  9.3  HGB 13.5 14.6 12.3  HCT 40.2 43.0 36.1  PLT 170  --  203  MCV 100.5*  --  98.9  MCH 33.8  --  33.7  MCHC 33.6  --  34.1  RDW 12.6  --  12.3  LYMPHSABS 0.9  --  1.1  MONOABS 0.7  --  0.9  EOSABS 0.0  --  0.0  BASOSABS 0.0  --  0.0    Chemistries   Recent Labs Lab 07/27/14 1140 07/30/14 1149  NA 139 139  K 3.7 3.6  CL 97 100  CO2  --  29  GLUCOSE 114* 111*  BUN 6 6  CREATININE 0.60 0.52  CALCIUM  --  9.2   ------------------------------------------------------------------------------------------------------------------ CrCl cannot be calculated (Unknown ideal weight.). ------------------------------------------------------------------------------------------------------------------ No results for input(s): HGBA1C in the last 72 hours. ------------------------------------------------------------------------------------------------------------------ No results for input(s): CHOL, HDL, LDLCALC, TRIG, CHOLHDL, LDLDIRECT in the last 72 hours. ------------------------------------------------------------------------------------------------------------------ No results for input(s): TSH, T4TOTAL, T3FREE, THYROIDAB in the last 72 hours.  Invalid input(s): FREET3 ------------------------------------------------------------------------------------------------------------------ No results  for input(s): VITAMINB12, FOLATE, FERRITIN, TIBC, IRON, RETICCTPCT in the last 72 hours.  Coagulation profile No results for input(s): INR, PROTIME in the last 168 hours.  No results for input(s): DDIMER in the last 72 hours.  Cardiac Enzymes No results for input(s): CKMB, TROPONINI, MYOGLOBIN in the last 168 hours.  Invalid input(s): CK ------------------------------------------------------------------------------------------------------------------ Invalid input(s): POCBNP  No results for input(s): GLUCAP in the last 72 hours.   RAI,RIPUDEEP M.D. [facility] 07/31/2014, 12:29 PM  Pager: [phone]   Between 7am to 7pm - call Pager - [phone]  After 7pm go to [email or web address] - password [email or web address]  Call night coverage person covering after 7pm

## 2014-07-31 NOTE — Clinical Social Work Note (Signed)
Clinical Social Work Assessment  Patient Details  Name: [name] MRN: [id number] Date of Birth: January 11, 1946  Date of referral:  07/31/14               Reason for consult:  Discharge Planning                Permission sought to share information with:  Other ([facility]) Permission granted to share information::  Yes, Verbal Permission Granted  Name::     [facility]  Agency::  [facility]  Relationship::     Contact Information:     Housing/Transportation Living arrangements for the past 2 months:  Assisted Living Facility ([facility] since February) Source of Information:  Spouse [name]) Patient Interpreter Needed:  None Criminal Activity/Legal Involvement Pertinent to Current Situation/Hospitalization:  No - Comment as needed Significant Relationships:  Spouse Lives with:  Facility Resident Do you feel safe going back to the place where you live?  Yes Need for family participation in patient care:  Yes (Comment) (Patient is in too much pain to contribute to assessment. )  Care giving concerns:  Husband [name] is very concerned about his wife (the patient). He hopes that the patient will be able to return to [facility] and receive therapy at this facility. [name] is also frustrated about "not having any answers." He feels as though no one is doing anything to help his wife.  Social Worker assessment / plan:  CSW provided emotional support to [employer] and explained that the treatment team is making every effort to determine what is causing his wife's pain. CSW explained that PT could possibly recommended SNF placement at discharge which may affect the patient's ability to return to [facility]. Fred doesn't really want to think about taking the patient to a SNF as the patient has had many negative experiences. CSW sensed that the patient's husband is quite overwhelmed to see how drastically the patient has changed over the past 4 days. According to [facility] the patient was managing pretty well  prior to her fall at [facility]. CSW explained that CSW will return to discuss disposition options with the husband once the patient has been evaluated by PT. It seems that pain control issues have delated evaluation. In the event SNF is recommended CSW will start SNF authorization, but it should be noted that the husband would really like for the patient to return to [facility]. I explained to the husband that a decision will need to be made quickly about SNF as there can be a lot of moving parts to have in place for the discharge. CSW will follow up.  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 ) PT Recommendations:  Not assessed at this time Information / Referral to community resources:  Skilled Nursing Facility  Patient/Family's Response to care:  Patient's husband is noticeably upset. He complains about the care the patient has received and doesn't think the team is doing much to find out what is wrong with "her knee." The husband is insistent that the patient not go to several of the SNFs in the area because of "their reputations." CSW has requested that the MD speak with the patient's husband.  Patient/Family's Understanding of and Emotional Response to Diagnosis, Current Treatment, and Prognosis:  As noted the husband [name] is very frustrated at this point. Unfortunately the MRI could not be performed as the patient could not straighten out her leg. [name] states that he just wants answers and for his wife to  get better.   Emotional Assessment Appearance:  Other (Comment Required (Patient is grimacing and appears to be in a lot of pain.) Attitude/Demeanor/Rapport:  Complaining, Angry, Other (Patient's husband [name] expresses that he is frustrated by "this whole experience.") Affect (typically observed):  Agitated, Anxious, Frustrated Orientation:  Oriented to Self Alcohol / Substance use:  Never Used Psych involvement (Current and /or in the  community):  No (Comment)  Discharge Needs  Concerns to be addressed:  Care Coordination, Discharge Planning Concerns Readmission within the last 30 days:  No Current discharge risk:  Cognitively Impaired, Dependent with Mobility Barriers to Discharge:  Other (Better pain control needed.)   Roddie Mc MSW, [place], [place], [phone]

## 2014-07-31 NOTE — Consult Note (Signed)
ORTHOPAEDIC CONSULTATION  REQUESTING PHYSICIAN: Ripudeep Krystal Eaton, MD  Chief Complaint: Right knee pain  HPI: [name] is a 69 y.o. female who reports that she does not walk. She has had occasional pain in the right knee but it feels okay when it is not being stretched out straight. She has a patch on the front of the knee but she is not sure what it is from. She reports that she did fall on this right knee but doesn't remember when.  Past Medical History  Diagnosis Date  . Osteoporosis   . Hypothyroid   . Depression   . Rheumatoid arthritis(714.0)   . DDD (degenerative disc disease)     bone spurs in spine also   Past Surgical History  Procedure Laterality Date  . Tonsillectomy    . Wisdom tooth extraction    . Abdominal hysterectomy      also removed left ovary  . Abdominal hysterectomy    . Orif humerus fracture  08/22/2011    Procedure: OPEN REDUCTION INTERNAL FIXATION (ORIF) HUMERAL SHAFT FRACTURE;  Surgeon: Mcarthur Rossetti, MD;  Location: [facility];  Service: Orthopedics;  Laterality: Left;  Open Reduction Internal Fixation Left Humeral Shaft Fracture   History   Social History  . Marital Status: Married    Spouse Name: N/A  . Number of Children: N/A  . Years of Education: N/A   Social History Main Topics  . Smoking status: Former Smoker -- 2.00 packs/day    Types: Cigarettes    Quit date: 05/15/1991  . Smokeless tobacco: Never Used  . Alcohol Use: Yes     Comment: sober for 30 years  history of alcoholism  . Drug Use: No  . Sexual Activity: Not Currently   Other Topics Concern  . None   Social History Narrative   Family History  Problem Relation Age of Onset  . Heart attack Mother   . Cancer Father   . Heart attack Brother   . Cancer Other   . Heart attack Other    Allergies  Allergen Reactions  . Sulfa Antibiotics Nausea Only   Prior to Admission medications   Medication Sig Start Date End Date Taking? Authorizing Provider    acetaminophen (TYLENOL) 500 MG tablet Take 1,000 mg by mouth at bedtime.    Yes Historical Provider, MD  aspirin 81 MG chewable tablet Chew 81 mg by mouth daily.   Yes Historical Provider, MD  Calcium Carb-Cholecalciferol 600-200 MG-UNIT TABS Take 1 tablet by mouth daily.   Yes Historical Provider, MD  Calcium Carbonate 500 MG CHEW Chew 1 tablet by mouth daily as needed (heartburn).   Yes Historical Provider, MD  cephALEXin (KEFLEX) 500 MG capsule Take 1 capsule (500 mg total) by mouth 3 (three) times daily. 07/27/14  Yes Jeffrey Hedges, PA-C  cholecalciferol (VITAMIN D) 1000 UNITS tablet Take 2,000 Units by mouth daily.   Yes Historical Provider, MD  Cyanocobalamin (VITAMIN B 12 PO) Take 1 tablet by mouth daily.   Yes Historical Provider, MD  diclofenac (FLECTOR) 1.3 % PTCH Place 1 patch onto the skin 2 (two) times daily.   Yes Historical Provider, MD  docusate sodium (COLACE) 100 MG capsule Take 200 mg by mouth at bedtime.   Yes Historical Provider, MD  donepezil (ARICEPT) 5 MG tablet Take 5 mg by mouth at bedtime.   Yes Historical Provider, MD  DULoxetine (CYMBALTA) 60 MG capsule Take 60 mg by mouth daily.   Yes Historical  Provider, MD  etanercept (ENBREL) 50 MG/ML injection Inject 50 mg into the skin once a week. On tuesdays.   Yes Historical Provider, MD  fluticasone (FLONASE) 50 MCG/ACT nasal spray Place 1 spray into both nostrils 2 (two) times daily.   Yes Historical Provider, MD  folic acid (FOLVITE) 1 MG tablet Take 1 mg by mouth daily.   Yes Historical Provider, MD  HYDROcodone-acetaminophen (NORCO/VICODIN) 5-325 MG per tablet Take 1 tablet by mouth every 6 (six) hours as needed. Patient taking differently: Take 1 tablet by mouth every 6 (six) hours as needed for moderate pain.  07/27/14  Yes Britt Bottom, NP  levothyroxine (SYNTHROID, LEVOTHROID) 88 MCG tablet Take 88 mcg by mouth daily before breakfast.   Yes Historical Provider, MD  Magnesium Oxide 250 MG TABS Take 250 mg by mouth  daily.   Yes Historical Provider, MD  methotrexate (RHEUMATREX) 2.5 MG tablet Take 7.5 mg by mouth once a week. Take on Mondays   Yes Historical Provider, MD  prenatal vitamin w/FE, FA (PRENATAL 1 + 1) 27-1 MG TABS Take 1 tablet by mouth daily.   Yes Historical Provider, MD  magnesium oxide (MAG-OX) 400 MG tablet Take 400 mg by mouth daily.    Historical Provider, MD   Dg Knee 1-2 Views Right  07/30/2014   CLINICAL DATA:  Acute right knee pain after fall. Initial encounter.  EXAM: RIGHT KNEE - 1-2 VIEW  COMPARISON:  None available currently.  FINDINGS: There is no evidence of fracture, dislocation, or joint effusion. There is no evidence of arthropathy or other focal bone abnormality. Soft tissues are unremarkable.  IMPRESSION: No significant abnormality seen in the right knee.   Electronically Signed   By: Marijo Conception, M.D.   On: 07/30/2014 13:29   Dg Foot Complete Right  07/30/2014   CLINICAL DATA:  Multiple falls with right foot pain.  EXAM: RIGHT FOOT COMPLETE - 3+ VIEW  COMPARISON:  None.  FINDINGS: Mild diffuse decreased bone mineralization. Persistent flexion at the second proximal interphalangeal joint. No definite acute fracture or dislocation.  IMPRESSION: No acute findings.   Electronically Signed   By: Marin Olp M.D.   On: 07/30/2014 13:31   Dg Hip Unilat With Pelvis 2-3 Views Right  07/30/2014   CLINICAL DATA:  Multiple falls. Severe pain. Patient unable to straighten RIGHT leg.  EXAM: RIGHT HIP (WITH PELVIS) 2-3 VIEWS  COMPARISON:  08/20/2011.  FINDINGS: There is no evidence of hip fracture or dislocation. There is no evidence of erosive arthropathy. Moderate callus surrounding RIGHT superior and inferior pubic ramus fractures.  IMPRESSION: Negative for acute abnormality.   Electronically Signed   By: Rolla Flatten M.D.   On: 07/30/2014 13:35    Positive ROS: All other systems have been reviewed and were otherwise negative with the exception of those mentioned in the HPI and as  above.  Labs cbc  Recent Labs  07/30/14 1149  WBC 9.3  HGB 12.3  HCT 36.1  PLT 203    Labs inflam No results for input(s): CRP in the last 72 hours.  Invalid input(s): ESR  Labs coag No results for input(s): INR, PTT in the last 72 hours.  Invalid input(s): PT   Recent Labs  07/30/14 1149  NA 139  K 3.6  CL 100  CO2 29  GLUCOSE 111*  BUN 6  CREATININE 0.52  CALCIUM 9.2    Physical Exam: Filed Vitals:   07/31/14 0510  BP: 155/60  Pulse: 80  Temp: 98.1 F (36.7 C)  Resp: 16   General: Alert, no acute distress Cardiovascular: No pedal edema Respiratory: No cyanosis, no use of accessory musculature GI: No organomegaly, abdomen is soft and non-tender Skin: No lesions in the area of chief complaint other than those listed below in MSK exam.  Neurologic: Sensation intact distally Psychiatric: Patient is somewhat confused Lymphatic: No axillary or cervical lymphadenopathy  MUSCULOSKELETAL:  RLE: Her compartments are soft her knee is nontender to palpation. She appears to have a flexion contracture about the knee with tight hamstrings as well. She has painless range of motion from 120 to about 45. No joint line tenderness no effusion. Other extremities are atraumatic with painless ROM and NVI.  Assessment: Chronic right knee contracture  Plan: I do not see any signs of infection arthritis or fracture within the knee. I recommend nonoperative management comfort control.  No intervention for clavicle or rami fractures these are age indeterminate weightbearing as tolerated bilateral upper extremities bilateral lower extremities.   I do not recommend MRI unless warranted for other medical reasons.  I will sign off at this time please call with any further questions or issues.  Renette Butters, MD Cell [phone]   07/31/2014 1:39 PM

## 2014-07-31 NOTE — Clinical Social Work Note (Signed)
Clinicals faxed to [facility] at [phone].   Roddie Mc MSW, [place], [place], [phone]

## 2014-07-31 NOTE — Progress Notes (Signed)
PT Cancellation Note  Patient Details Name: [name] MRN: [id number] DOB: Mar 28, 1946   Cancelled Treatment:    Reason Eval/Treat Not Completed: Medical issues which prohibited therapy. Pt just returned from MRI, where she was unable to tolerate positioning and it was canceled.  Pt moaning/yelling out in pain.  Spoke with nursing and will hold off on PT eval today.  Will check back tomorrow to attempt evaluation.   Kratos Ruscitti LUBECK 07/31/2014, 11:01 AM

## 2014-07-31 NOTE — Progress Notes (Signed)
INITIAL NUTRITION ASSESSMENT  DOCUMENTATION CODES Per approved criteria  -Severe malnutrition in the context of chronic illness   INTERVENTION: Continue Ensure Enlive po BID, each supplement provides 350 kcal and 20 grams of protein  NUTRITION DIAGNOSIS: Malnutrition related to chronic illness as evidenced by severe fat and muscle depletion.   Goal: Pt to meet >/= 90% of their estimated nutrition needs   Monitor:  PO intake, supplement acceptance, weight trends, labs  Reason for Assessment: Pt identified as at nutrition risk on the Malnutrition Screen Tool  ASSESSMENT: Pt with prior h/o hypothyroidism, RA, had a mechanical fall to the right side on 4.18 and ended up having right superior and inferior pubic ramus fractures, and fractures of the anterior, medial and posterior lateral walls of the right maxillary sinus and of the right zygomatic arch and the lateral wall of the right orbit. Pt admitted due to pain control.  Seen pt who was in pain and calling out, notified RN.  Lunch at bedside untouched. Pt reports no appetite due to pain.   Nutrition Focused Physical Exam:  Subcutaneous Fat:  Orbital Region: severe depletion Upper Arm Region: severe depletion Thoracic and Lumbar Region: severe depletion  Muscle:  Temple Region: severe depletion Clavicle Bone Region: severe depletion Clavicle and Acromion Bone Region: severe depletion Scapular Bone Region: severe depletion Dorsal Hand: severe depletion Patellar Region: severe depletion Anterior Thigh Region: severe depletion Posterior Calf Region: severe depletion  Edema: not present   Height: Ht Readings from Last 1 Encounters:  08/21/11 5\' 4"  (1.626 m)    Weight: Wt Readings from Last 1 Encounters:  08/21/11 135 lb (61.236 kg)    Ideal Body Weight: 54.5 kg   % Ideal Body Weight: 112%  Wt Readings from Last 10 Encounters:  08/21/11 135 lb (61.236 kg)    Usual Body Weight: unknown  % Usual Body Weight:  -  BMI:  There is no weight on file to calculate BMI.  Estimated Nutritional Needs: Kcal: 1500-1650 Protein: 70-80 grams Fluid: > 1.5 L/day  Skin:  Stage II sacrum  Laceration right face  Diet Order:    EDUCATION NEEDS: -No education needs identified at this time   Intake/Output Summary (Last 24 hours) at 07/31/14 1054 Last data filed at 07/31/14 0721  Gross per 24 hour  Intake    200 ml  Output    625 ml  Net   -425 ml    Last BM: 4/20   Labs:   Recent Labs Lab 07/27/14 1140 07/30/14 1149  NA 139 139  K 3.7 3.6  CL 97 100  CO2  --  29  BUN 6 6  CREATININE 0.60 0.52  CALCIUM  --  9.2  GLUCOSE 114* 111*    CBG (last 3)  No results for input(s): GLUCAP in the last 72 hours.  Scheduled Meds: . aspirin  81 mg Oral Daily  . cephALEXin  500 mg Oral TID  . diclofenac  1 patch Transdermal BID  . docusate sodium  200 mg Oral QHS  . donepezil  5 mg Oral QHS  . DULoxetine  60 mg Oral Daily  . enoxaparin (LOVENOX) injection  40 mg Subcutaneous Q24H  . feeding supplement (ENSURE ENLIVE)  237 mL Oral BID BM  . fluticasone  1 spray Each Nare BID  . folic acid  1 mg Oral Daily  . levothyroxine  88 mcg Oral QAC breakfast  . magnesium oxide  200 mg Oral Daily    Continuous Infusions:  [place], [place], [place] Pager [phone] After Hours Pager

## 2014-08-01 NOTE — Social Work (Signed)
CSW met with patient's husband about SNF placement. Patient's husband agreeable to placement. Husband agreeable to patient being faxed to SNFs in all of [place] except 1. CSW complied and faxed patient out to 21 facilities and will provide bed offers as available.   Christene Lye MSW, [place]

## 2014-08-01 NOTE — Progress Notes (Signed)
[facility]                                                                              Patient Demographics  [name], is a 69 y.o. female, DOB - [birth year], ZOX:[id number]  Admit date - 07/30/2014   Admitting Physician Kathlen Mody, MD  Outpatient Primary MD for the patient is Juline Patch, MD  LOS - 2   Chief Complaint  Patient presents with  . Fall       Brief HPI   [name] is a 69 y.o. female with prior h/o hypothyroidism, RA, had a mechanical fall to the right side on 4.18 and ended up having right superior and inferior pubic ramus fractures, and fractures of the anterior, medial and posterior lateral walls of the right maxillary sinus and of the right zygomatic arch and the lateral wall of the right orbit. She was started on oral hydrocodone and was seen by ENT on 4/18. She was also started on keflex for maxillary sinus wall fractures. But she reported persistent and had to be transferred to ED for pain control. In ED she was given one percocet and referred tom edical service for admission for pain control. On arrival to ED, she was in moderate distress from knee pain, tachycardic and hypertensive. Labs were unremarkable. She will be admitted to m ed surg for pain control.    Assessment & Plan    Principal Problem:   Right knee pain; unclear etiology, patient had a mechanical fall on the right side on 4/18 had had several fractures including right superior and inferior pubic rami fracture and right maxillary sinus fracture, right orbital fracture - X-ray of the right knee did not show any fracture or dislocation - Orthopedics consulted, patient was admitted by Dr. Eulah Pont who recommended nonoperative pain control, no signs of any septic arthritis or fracture. - Pain control is still a significant issues, patient moaning, states pain 9/10, palliative medicine consult requested for pain control and goals of care.  - PT evaluation once pain is better  controlled  Active Problems:   Hypothyroid Continue Synthroid    Fracture of multiple pubic rami - Nonoperative, PTOT   Depression with dementia - Continue Cymbalta, Aricept  Recent fracture of inferior, medial, posterior lateral walls of right maxilla sinus, right zygomatic arch, lateral wall of the right orbit -Continue Keflex as recommended by ENT    right distal clavicular fracture, subtle nondisplaced fracture of fifth metacarpal distally - Continue shoulder immobilizer for comfort, wrist splint   Code Status: Full code  Family Communication: Discussed in detail with the patient, all imaging results, lab results explained to the patient    Disposition Plan: Currently at [facility] assisted [facility] facility, may need skilled nursing facility  Time Spent in minutes  25 minutes  Procedures  None   Consults   Orthopedics  DVT Prophylaxis   Lovenox   Medications  Scheduled Meds: . antiseptic oral rinse  7 mL Mouth Rinse BID  . aspirin  81 mg Oral Daily  . cephALEXin  500 mg Oral TID  . diclofenac  1 patch Transdermal BID  . docusate sodium  200 mg Oral QHS  . donepezil  5 mg Oral QHS  . DULoxetine  60 mg Oral Daily  . enoxaparin (LOVENOX) injection  40 mg Subcutaneous Q24H  . feeding supplement (ENSURE ENLIVE)  237 mL Oral BID BM  . fluticasone  1 spray Each Nare BID  . folic acid  1 mg Oral Daily  . levothyroxine  88 mcg Oral QAC breakfast  . magnesium oxide  200 mg Oral Daily   Continuous Infusions:  PRN Meds:.bisacodyl, fentaNYL (SUBLIMAZE) injection, morphine injection, ondansetron **OR** ondansetron (ZOFRAN) IV, oxyCODONE-acetaminophen, polyethylene glycol   Antibiotics   Anti-infectives    Start     Dose/Rate Route Frequency Ordered Stop   07/30/14 1600  cephALEXin (KEFLEX) capsule 500 mg     500 mg Oral 3 times daily 07/30/14 1523          Subjective:   [name] was seen and examined today. Patient denies dizziness, chest pain, shortness  of breath, abdominal pain, N/V/D/C, new weakness, numbess, tingling.  Complaining of pain in the right leg and hip repair 9/10, moaning, does not want to straighten the right leg  Objective:   Blood pressure 150/71, pulse 106, temperature 98.4 F (36.9 C), temperature source Oral, resp. rate 16, height 5\' 4"  (1.626 m), weight 48.081 kg (106 lb), SpO2 98 %.  Wt Readings from Last 3 Encounters:  07/31/14 48.081 kg (106 lb)  08/21/11 61.236 kg (135 lb)     Intake/Output Summary (Last 24 hours) at 08/01/14 1215 Last data filed at 08/01/14 0559  Gross per 24 hour  Intake    100 ml  Output    300 ml  Net   -200 ml    Exam  General: Alert and oriented x 3,  Uncomfortable  HEENT:  PERRLA, EOMI, Anicteic Sclera, mucous membranes moist.   Neck: Supple, no JVD, no masses  CVS: S1 S2 clear, regular rate and rhythm  Respiratory: CTA B  Abdomen: Soft, nontender, nondistended, + bowel sounds  Ext: no cyanosis clubbing or edema, keeps right knee bent,? Contracture   Neuro: AAOx3, Cr N's II- XII, unable to examine lower extremities due to lack of patient cooperation and pain  skin: No rashes  Psych: Normal affect and demeanor, alert and oriented x3    Data Review   Micro Results No results found for this or any previous visit (from the past 240 hour(s)).  Radiology Reports Dg Shoulder Right  07/27/2014   CLINICAL DATA:  Larey Seat this morning, bruising and pain RIGHT shoulder  EXAM: RIGHT SHOULDER - 2+ VIEW  COMPARISON:  None  FINDINGS: Diffuse osseous demineralization.  AC joint alignment normal.  Slight irregularity of the distal clavicle question age-indeterminate distal RIGHT clavicular fracture.  No additional fracture, dislocation or bone destruction.  Visualized RIGHT ribs intact.  IMPRESSION: Osseous demineralization with questionable age-indeterminate distal RIGHT clavicular fracture.  No additional focal bony abnormality.   Electronically Signed   By: Ulyses Southward M.D.   On:  07/27/2014 13:29   Dg Knee 1-2 Views Right  07/30/2014   CLINICAL DATA:  Acute right knee pain after fall. Initial encounter.  EXAM: RIGHT KNEE - 1-2 VIEW  COMPARISON:  None available currently.  FINDINGS: There is no evidence of fracture, dislocation, or joint effusion. There is no evidence of arthropathy or other focal bone abnormality. Soft tissues are unremarkable.  IMPRESSION: No significant abnormality seen in the right knee.   Electronically Signed   By: Lupita Raider, M.D.  On: 07/30/2014 13:29   Ct Head Wo Contrast  07/27/2014   CLINICAL DATA:  Fall. Laceration right-sided forehead. Bruising to the right eye. Epistaxis. Initial encounter. Personal history of dementia.  EXAM: CT HEAD WITHOUT CONTRAST  CT CERVICAL SPINE WITHOUT CONTRAST  TECHNIQUE: Multidetector CT imaging of the head and cervical spine was performed following the standard protocol without intravenous contrast. Multiplanar CT image reconstructions of the cervical spine were also generated.  COMPARISON:  MRI brain 04/19/2014  FINDINGS: CT HEAD FINDINGS  There is marked cerebral atrophy particularly involving the parietal and temporal lobes bilaterally. The ventricular system is dilated proportionate to the degree of atrophy. Moderate white matter changes are again seen bilaterally.  No acute infarct, hemorrhage, or mass lesion is present.  A posterolateral right maxillary sinus fracture is present. There is a comminuted fracture involving the anterior wall of the right maxillary sinus is well. An orbital floor fracture is suspected. The fracture extends superiorly to involve the lateral wall of the right orbit. The globe is intact. There is a segmental fracture of the right zygomatic arch.  Limited supraorbital soft tissue swelling is present without additional fractures.  CT CERVICAL SPINE FINDINGS  Cervical spine is imaged from the skullbase through T1-2. A hard collar is in place. Multilevel degenerative changes are present.  Uncovertebral spurring is worse on the left at C3-4, C4-5, and C5-6. Left-sided osseous foraminal narrowing is present at these levels. No acute fracture or traumatic subluxation is present.  IMPRESSION: 1. Stable atrophy and white matter disease. The atrophy pattern is most prominent in the parietal and temporal lobes consistent with an Alzheimer's type pattern. 2. No acute intracranial abnormality. 3. Comminuted right maxillary sinus fracture with the blood layer within the sinus. 4. Suspect right orbital floor fracture. CT of the maxillofacial sinuses would be useful for further evaluation of this type fracture. 5. Comminuted right lateral orbital wall fracture and segmental right zygomatic arch fracture. 6. Degenerative changes of the cervical spine without evidence for acute trauma.   Electronically Signed   By: Marin Roberts M.D.   On: 07/27/2014 12:59   Ct Cervical Spine Wo Contrast  07/27/2014   CLINICAL DATA:  Fall. Laceration right-sided forehead. Bruising to the right eye. Epistaxis. Initial encounter. Personal history of dementia.  EXAM: CT HEAD WITHOUT CONTRAST  CT CERVICAL SPINE WITHOUT CONTRAST  TECHNIQUE: Multidetector CT imaging of the head and cervical spine was performed following the standard protocol without intravenous contrast. Multiplanar CT image reconstructions of the cervical spine were also generated.  COMPARISON:  MRI brain 04/19/2014  FINDINGS: CT HEAD FINDINGS  There is marked cerebral atrophy particularly involving the parietal and temporal lobes bilaterally. The ventricular system is dilated proportionate to the degree of atrophy. Moderate white matter changes are again seen bilaterally.  No acute infarct, hemorrhage, or mass lesion is present.  A posterolateral right maxillary sinus fracture is present. There is a comminuted fracture involving the anterior wall of the right maxillary sinus is well. An orbital floor fracture is suspected. The fracture extends superiorly to  involve the lateral wall of the right orbit. The globe is intact. There is a segmental fracture of the right zygomatic arch.  Limited supraorbital soft tissue swelling is present without additional fractures.  CT CERVICAL SPINE FINDINGS  Cervical spine is imaged from the skullbase through T1-2. A hard collar is in place. Multilevel degenerative changes are present. Uncovertebral spurring is worse on the left at C3-4, C4-5, and C5-6. Left-sided osseous foraminal narrowing  is present at these levels. No acute fracture or traumatic subluxation is present.  IMPRESSION: 1. Stable atrophy and white matter disease. The atrophy pattern is most prominent in the parietal and temporal lobes consistent with an Alzheimer's type pattern. 2. No acute intracranial abnormality. 3. Comminuted right maxillary sinus fracture with the blood layer within the sinus. 4. Suspect right orbital floor fracture. CT of the maxillofacial sinuses would be useful for further evaluation of this type fracture. 5. Comminuted right lateral orbital wall fracture and segmental right zygomatic arch fracture. 6. Degenerative changes of the cervical spine without evidence for acute trauma.   Electronically Signed   By: Marin Roberts M.D.   On: 07/27/2014 12:59   Dg Hand Complete Right  07/27/2014   CLINICAL DATA:  Right hand pain post fall this morning  EXAM: RIGHT HAND - COMPLETE 3+ VIEW  COMPARISON:  07/20/2013  FINDINGS: Three views of the right hand submitted. No displaced fracture or subluxation. There is cortical irregularity distal aspect of fifth metacarpal suspicious for subtle nondisplaced fracture. Clinical correlation is necessary.  IMPRESSION: No displaced fracture or subluxation. There is cortical irregularity distal aspect of fifth metacarpal suspicious for subtle nondisplaced fracture. Clinical correlation is necessary.   Electronically Signed   By: Natasha Mead M.D.   On: 07/27/2014 13:12   Dg Foot Complete Right  07/30/2014    CLINICAL DATA:  Multiple falls with right foot pain.  EXAM: RIGHT FOOT COMPLETE - 3+ VIEW  COMPARISON:  None.  FINDINGS: Mild diffuse decreased bone mineralization. Persistent flexion at the second proximal interphalangeal joint. No definite acute fracture or dislocation.  IMPRESSION: No acute findings.   Electronically Signed   By: Elberta Fortis M.D.   On: 07/30/2014 13:31   Dg Hip Unilat With Pelvis 2-3 Views Right  07/30/2014   CLINICAL DATA:  Multiple falls. Severe pain. Patient unable to straighten RIGHT leg.  EXAM: RIGHT HIP (WITH PELVIS) 2-3 VIEWS  COMPARISON:  08/20/2011.  FINDINGS: There is no evidence of hip fracture or dislocation. There is no evidence of erosive arthropathy. Moderate callus surrounding RIGHT superior and inferior pubic ramus fractures.  IMPRESSION: Negative for acute abnormality.   Electronically Signed   By: Davonna Belling M.D.   On: 07/30/2014 13:35   Ct Maxillofacial Wo Cm  07/27/2014   CLINICAL DATA:  The patient fell and has right facial trauma with a laceration and facial fractures.  EXAM: CT MAXILLOFACIAL WITHOUT CONTRAST  TECHNIQUE: Multidetector CT imaging of the maxillofacial structures was performed. Multiplanar CT image reconstructions were also generated. A small metallic BB was placed on the right temple in order to reliably differentiate right from left.  COMPARISON:  None.  FINDINGS: There is a tripod fracture of the right facial bones with a slightly displaced fracture of the posterior aspect of the right zygomatic arch, anterior and lateral wall maxillary sinus fractures, nondisplaced hairline fractures through the medial wall of the right maxillary sinus, and minimally depressed fracture of the floor of the right orbit. The right inferior orbital rim appears to be intact. There is no entrapment of extraocular muscles of the right orbit. There is an blood fluid level in the right maxillary sinus.  Fracture of the lateral wall of the right orbit.  The other facial  bones are normal.  Note is made of severe temporal lobe atrophy.  IMPRESSION: Fractures of the anterior, medial, and posterior lateral walls of the right maxillary sinus as well as of the right zygomatic arch and  of the lateral wall of the right orbit. No muscle entrapment.   Electronically Signed   By: Francene Boyers M.D.   On: 07/27/2014 14:42    CBC  Recent Labs Lab 07/27/14 1128 07/27/14 1140 07/30/14 1149  WBC 8.6  --  9.3  HGB 13.5 14.6 12.3  HCT 40.2 43.0 36.1  PLT 170  --  203  MCV 100.5*  --  98.9  MCH 33.8  --  33.7  MCHC 33.6  --  34.1  RDW 12.6  --  12.3  LYMPHSABS 0.9  --  1.1  MONOABS 0.7  --  0.9  EOSABS 0.0  --  0.0  BASOSABS 0.0  --  0.0    Chemistries   Recent Labs Lab 07/27/14 1140 07/30/14 1149  NA 139 139  K 3.7 3.6  CL 97 100  CO2  --  29  GLUCOSE 114* 111*  BUN 6 6  CREATININE 0.60 0.52  CALCIUM  --  9.2   ------------------------------------------------------------------------------------------------------------------ estimated creatinine clearance is 49 mL/min (by C-G formula based on Cr of 0.52). ------------------------------------------------------------------------------------------------------------------ No results for input(s): HGBA1C in the last 72 hours. ------------------------------------------------------------------------------------------------------------------ No results for input(s): CHOL, HDL, LDLCALC, TRIG, CHOLHDL, LDLDIRECT in the last 72 hours. ------------------------------------------------------------------------------------------------------------------ No results for input(s): TSH, T4TOTAL, T3FREE, THYROIDAB in the last 72 hours.  Invalid input(s): FREET3 ------------------------------------------------------------------------------------------------------------------ No results for input(s): VITAMINB12, FOLATE, FERRITIN, TIBC, IRON, RETICCTPCT in the last 72 hours.  Coagulation profile No results for input(s):  INR, PROTIME in the last 168 hours.  No results for input(s): DDIMER in the last 72 hours.  Cardiac Enzymes No results for input(s): CKMB, TROPONINI, MYOGLOBIN in the last 168 hours.  Invalid input(s): CK ------------------------------------------------------------------------------------------------------------------ Invalid input(s): POCBNP  No results for input(s): GLUCAP in the last 72 hours.   Lynea Rollison M.D. [facility] 08/01/2014, 12:15 PM  Pager: [phone]   Between 7am to 7pm - call Pager - [phone]  After 7pm go to [email or web address] - password [email or web address]  Call night coverage person covering after 7pm

## 2014-08-01 NOTE — Evaluation (Signed)
Physical Therapy Evaluation Patient Details Name: [name] MRN: [id number] DOB: 1945-11-07 Today's Date: 08/01/2014   History of Present Illness  Pt is 69 y/o female with hypothyroidism, RA, had a mechanical fall to the right side on 07/27/14 resulting in right superior and inferior pubic ramus fractures, and fractures of the anterior, medial and posterior lateral walls of the right maxillary sinus and of the right zygomatic arch and the lateral wall of the right orbit.  Pt with right UE in sling with the following right distal clavicular fracture, subtle nondisplaced fracture of fifth metacarpal distally.  Pt currently admitted due to uncontrollable right knee pain.  Clinical Impression  Pt admitted with above diagnosis. Pt currently with functional limitations due to the deficits listed below (see PT Problem List).Pt will benefit from skilled PT to increase their independence and safety with mobility to allow discharge to the venue listed below.        Follow Up Recommendations SNF    Equipment Recommendations  None recommended by PT    Recommendations for Other Services       Precautions / Restrictions Precautions Precautions: Fall Restrictions Weight Bearing Restrictions: Yes RUE Weight Bearing: Non weight bearing Other Position/Activity Restrictions: No orders for UE support      Mobility  Bed Mobility Overal bed mobility: Needs Assistance Bed Mobility: Supine to Sit;Sit to Supine     Supine to sit: Mod assist Sit to supine: Max assist   General bed mobility comments: Assistance to elevate trunk OOB and LE OOB with cues for proper technique.  Pt limited  due to overall pain with moaing at times.   Transfers                    Ambulation/Gait                Stairs            Wheelchair Mobility    Modified Rankin (Stroke Patients Only)       Balance Overall balance assessment: History of Falls;Needs assistance Sitting-balance  support: Single extremity supported Sitting balance-Leahy Scale: Poor   Postural control: Posterior lean                                   Pertinent Vitals/Pain      Home Living Family/patient expects to be discharged to:: Assisted living                      Prior Function           Comments: unable to determine due to pt cognitive impairment      Hand Dominance   Dominant Hand: Right    Extremity/Trunk Assessment   Upper Extremity Assessment: Difficult to assess due to impaired cognition           Lower Extremity Assessment: Difficult to assess due to impaired cognition;RLE deficits/detail RLE Deficits / Details: Pt with right knee pain with limited ROM.  Pt keeping right LE in flexed position however able to get pt to relax knee and able to reach 30 degrees of flexion (lack of extension).        Communication   Communication: No difficulties  Cognition Arousal/Alertness: Awake/alert Behavior During Therapy: Restless;Anxious Overall Cognitive Status: No family/caregiver present to determine baseline cognitive functioning Area of Impairment: Orientation Orientation Level: Place;Time;Situation   Memory: Decreased short-term memory  General Comments: Pt limited due to overall cognitive impairment with anxiety and restlessness limiting evaluation as well as pain limiting overall functional mobility.     General Comments      Exercises        Assessment/Plan    PT Assessment Patient needs continued PT services  PT Diagnosis Generalized weakness;Acute pain   PT Problem List Decreased range of motion;Decreased activity tolerance;Decreased balance;Decreased mobility;Decreased knowledge of use of DME;Decreased safety awareness;Pain  PT Treatment Interventions Functional mobility training;Therapeutic activities;Therapeutic exercise;Balance training;Patient/family education   PT Goals (Current goals can be found in the Care  Plan section) Acute Rehab PT Goals Patient Stated Goal: Unable to give goal due to cognitive impairment PT Goal Formulation: Patient unable to participate in goal setting Time For Goal Achievement: 08/15/14 Potential to Achieve Goals: Fair    Frequency Min 3X/week   Barriers to discharge        Co-evaluation               End of Session                 Time: 1016-1040 PT Time Calculation (min) (ACUTE ONLY): 24 min   Charges:   PT Evaluation $Initial PT Evaluation Tier I: 1 Procedure PT Treatments $Therapeutic Activity: 8-22 mins   PT G Codes:        Venesha Petraitis 08-03-14, 11:36 AM   Jake Shark, PT DPT [phone]

## 2014-08-02 NOTE — Progress Notes (Signed)
[facility]                                                                              Patient Demographics  [name], is a 69 y.o. female, DOB - [birth year], ZOX:[id number]  Admit date - 07/30/2014   Admitting Physician Kathlen Mody, MD  Outpatient Primary MD for the patient is Juline Patch, MD  LOS - 3   Chief Complaint  Patient presents with  . Fall       Brief HPI   [name] is a 69 y.o. female with prior h/o hypothyroidism, RA, had a mechanical fall to the right side on 4.18 and ended up having right superior and inferior pubic ramus fractures, and fractures of the anterior, medial and posterior lateral walls of the right maxillary sinus and of the right zygomatic arch and the lateral wall of the right orbit. She was started on oral hydrocodone and was seen by ENT on 4/18. She was also started on keflex for maxillary sinus wall fractures. But she reported persistent and had to be transferred to ED for pain control. In ED she was given one percocet and referred tom edical service for admission for pain control. On arrival to ED, she was in moderate distress from knee pain, tachycardic and hypertensive. Labs were unremarkable. She will be admitted to m ed surg for pain control.    Assessment & Plan    Principal Problem:   Right knee pain; unclear etiology, patient had a mechanical fall on the right side on 4/18 had had several fractures including right superior and inferior pubic rami fracture and right maxillary sinus fracture, right orbital fracture - X-ray of the right knee did not show any fracture or dislocation - Orthopedics consulted, patient was admitted by Dr. Eulah Pont who recommended nonoperative pain control, no signs of any septic arthritis or fracture. - Pain control still a significant issue, PT evaluation recommended skilled nursing facility  - Palliative medicine consult placed for pain control and goals of care - During examination,  patient initially complained of left shoulder pain however she has no problem in the range of motion, there is no tenderness  Active Problems:   Hypothyroid Continue Synthroid    Fracture of multiple pubic rami - Nonoperative, PTOT   Depression with dementia - Continue Cymbalta, Aricept  Recent fracture of inferior, medial, posterior lateral walls of right maxilla sinus, right zygomatic arch, lateral wall of the right orbit -Continue Keflex as recommended by ENT    right distal clavicular fracture, subtle nondisplaced fracture of fifth metacarpal distally - Continue shoulder immobilizer for comfort, wrist splint   Code Status: Full code  Family Communication: Discussed in detail with the patient, all imaging results, lab results explained to the patient    Disposition Plan: Currently at [facility] assisted living facility, will need skilled nursing facility, awaiting palliative medicine recommendations regarding pain control, goals of care  Time Spent in minutes  25 minutes  Procedures  None   Consults   Orthopedics  DVT Prophylaxis   Lovenox   Medications  Scheduled Meds: . antiseptic oral rinse  7 mL Mouth Rinse BID  .  aspirin  81 mg Oral Daily  . cephALEXin  500 mg Oral TID  . diclofenac  1 patch Transdermal BID  . docusate sodium  200 mg Oral QHS  . donepezil  5 mg Oral QHS  . DULoxetine  60 mg Oral Daily  . enoxaparin (LOVENOX) injection  40 mg Subcutaneous Q24H  . feeding supplement (ENSURE ENLIVE)  237 mL Oral BID BM  . fluticasone  1 spray Each Nare BID  . folic acid  1 mg Oral Daily  . levothyroxine  88 mcg Oral QAC breakfast  . magnesium oxide  200 mg Oral Daily   Continuous Infusions:  PRN Meds:.bisacodyl, fentaNYL (SUBLIMAZE) injection, morphine injection, ondansetron **OR** ondansetron (ZOFRAN) IV, oxyCODONE-acetaminophen, polyethylene glycol   Antibiotics   Anti-infectives    Start     Dose/Rate Route Frequency Ordered Stop   07/30/14 1600   cephALEXin (KEFLEX) capsule 500 mg     500 mg Oral 3 times daily 07/30/14 1523          Subjective:   [name] was seen and examined today. Patient denies dizziness, chest pain, shortness of breath, abdominal pain, N/V/D/C, new weakness, numbess, tingling.  Complaining of pain in the right leg pain. Afebrile   Objective:   Blood pressure 143/58, pulse 76, temperature 98.1 F (36.7 C), temperature source Oral, resp. rate 15, height 5\' 4"  (1.626 m), weight 48.081 kg (106 lb), SpO2 97 %.  Wt Readings from Last 3 Encounters:  07/31/14 48.081 kg (106 lb)  08/21/11 61.236 kg (135 lb)     Intake/Output Summary (Last 24 hours) at 08/02/14 1153 Last data filed at 08/02/14 0300  Gross per 24 hour  Intake    240 ml  Output    375 ml  Net   -135 ml    Exam  General: Alert and oriented x 3,  Uncomfortable  HEENT:  PERRLA, EOMI, Anicteic Sclera, mucous membranes moist.   Neck: Supple, no JVD, no masses  CVS: S1 S2 clear, regular rate and rhythm  Respiratory: CTA B  Abdomen: Soft, nontender, nondistended, + bowel sounds  Ext: no cyanosis clubbing or edema, keeps right knee bent,? Contracture. Left shoulder range of movement is normal, no pain on assessment, no tenderness on examination   Neuro: AAOx3, Cr N's II- XII, unable to examine lower extremities due to lack of patient cooperation and pain  skin: No rashes  Psych: Normal affect and demeanor, alert and oriented x3    Data Review   Micro Results No results found for this or any previous visit (from the past 240 hour(s)).  Radiology Reports Dg Shoulder Right  07/27/2014   CLINICAL DATA:  Larey Seat this morning, bruising and pain RIGHT shoulder  EXAM: RIGHT SHOULDER - 2+ VIEW  COMPARISON:  None  FINDINGS: Diffuse osseous demineralization.  AC joint alignment normal.  Slight irregularity of the distal clavicle question age-indeterminate distal RIGHT clavicular fracture.  No additional fracture, dislocation or bone  destruction.  Visualized RIGHT ribs intact.  IMPRESSION: Osseous demineralization with questionable age-indeterminate distal RIGHT clavicular fracture.  No additional focal bony abnormality.   Electronically Signed   By: Ulyses Southward M.D.   On: 07/27/2014 13:29   Dg Knee 1-2 Views Right  07/30/2014   CLINICAL DATA:  Acute right knee pain after fall. Initial encounter.  EXAM: RIGHT KNEE - 1-2 VIEW  COMPARISON:  None available currently.  FINDINGS: There is no evidence of fracture, dislocation, or joint effusion. There is no evidence of arthropathy or  other focal bone abnormality. Soft tissues are unremarkable.  IMPRESSION: No significant abnormality seen in the right knee.   Electronically Signed   By: Lupita Raider, M.D.   On: 07/30/2014 13:29   Ct Head Wo Contrast  07/27/2014   CLINICAL DATA:  Fall. Laceration right-sided forehead. Bruising to the right eye. Epistaxis. Initial encounter. Personal history of dementia.  EXAM: CT HEAD WITHOUT CONTRAST  CT CERVICAL SPINE WITHOUT CONTRAST  TECHNIQUE: Multidetector CT imaging of the head and cervical spine was performed following the standard protocol without intravenous contrast. Multiplanar CT image reconstructions of the cervical spine were also generated.  COMPARISON:  MRI brain 04/19/2014  FINDINGS: CT HEAD FINDINGS  There is marked cerebral atrophy particularly involving the parietal and temporal lobes bilaterally. The ventricular system is dilated proportionate to the degree of atrophy. Moderate white matter changes are again seen bilaterally.  No acute infarct, hemorrhage, or mass lesion is present.  A posterolateral right maxillary sinus fracture is present. There is a comminuted fracture involving the anterior wall of the right maxillary sinus is well. An orbital floor fracture is suspected. The fracture extends superiorly to involve the lateral wall of the right orbit. The globe is intact. There is a segmental fracture of the right zygomatic arch.   Limited supraorbital soft tissue swelling is present without additional fractures.  CT CERVICAL SPINE FINDINGS  Cervical spine is imaged from the skullbase through T1-2. A hard collar is in place. Multilevel degenerative changes are present. Uncovertebral spurring is worse on the left at C3-4, C4-5, and C5-6. Left-sided osseous foraminal narrowing is present at these levels. No acute fracture or traumatic subluxation is present.  IMPRESSION: 1. Stable atrophy and white matter disease. The atrophy pattern is most prominent in the parietal and temporal lobes consistent with an Alzheimer's type pattern. 2. No acute intracranial abnormality. 3. Comminuted right maxillary sinus fracture with the blood layer within the sinus. 4. Suspect right orbital floor fracture. CT of the maxillofacial sinuses would be useful for further evaluation of this type fracture. 5. Comminuted right lateral orbital wall fracture and segmental right zygomatic arch fracture. 6. Degenerative changes of the cervical spine without evidence for acute trauma.   Electronically Signed   By: Marin Roberts M.D.   On: 07/27/2014 12:59   Ct Cervical Spine Wo Contrast  07/27/2014   CLINICAL DATA:  Fall. Laceration right-sided forehead. Bruising to the right eye. Epistaxis. Initial encounter. Personal history of dementia.  EXAM: CT HEAD WITHOUT CONTRAST  CT CERVICAL SPINE WITHOUT CONTRAST  TECHNIQUE: Multidetector CT imaging of the head and cervical spine was performed following the standard protocol without intravenous contrast. Multiplanar CT image reconstructions of the cervical spine were also generated.  COMPARISON:  MRI brain 04/19/2014  FINDINGS: CT HEAD FINDINGS  There is marked cerebral atrophy particularly involving the parietal and temporal lobes bilaterally. The ventricular system is dilated proportionate to the degree of atrophy. Moderate white matter changes are again seen bilaterally.  No acute infarct, hemorrhage, or mass lesion is  present.  A posterolateral right maxillary sinus fracture is present. There is a comminuted fracture involving the anterior wall of the right maxillary sinus is well. An orbital floor fracture is suspected. The fracture extends superiorly to involve the lateral wall of the right orbit. The globe is intact. There is a segmental fracture of the right zygomatic arch.  Limited supraorbital soft tissue swelling is present without additional fractures.  CT CERVICAL SPINE FINDINGS  Cervical spine is imaged  from the skullbase through T1-2. A hard collar is in place. Multilevel degenerative changes are present. Uncovertebral spurring is worse on the left at C3-4, C4-5, and C5-6. Left-sided osseous foraminal narrowing is present at these levels. No acute fracture or traumatic subluxation is present.  IMPRESSION: 1. Stable atrophy and white matter disease. The atrophy pattern is most prominent in the parietal and temporal lobes consistent with an Alzheimer's type pattern. 2. No acute intracranial abnormality. 3. Comminuted right maxillary sinus fracture with the blood layer within the sinus. 4. Suspect right orbital floor fracture. CT of the maxillofacial sinuses would be useful for further evaluation of this type fracture. 5. Comminuted right lateral orbital wall fracture and segmental right zygomatic arch fracture. 6. Degenerative changes of the cervical spine without evidence for acute trauma.   Electronically Signed   By: Marin Roberts M.D.   On: 07/27/2014 12:59   Dg Hand Complete Right  07/27/2014   CLINICAL DATA:  Right hand pain post fall this morning  EXAM: RIGHT HAND - COMPLETE 3+ VIEW  COMPARISON:  07/20/2013  FINDINGS: Three views of the right hand submitted. No displaced fracture or subluxation. There is cortical irregularity distal aspect of fifth metacarpal suspicious for subtle nondisplaced fracture. Clinical correlation is necessary.  IMPRESSION: No displaced fracture or subluxation. There is  cortical irregularity distal aspect of fifth metacarpal suspicious for subtle nondisplaced fracture. Clinical correlation is necessary.   Electronically Signed   By: Natasha Mead M.D.   On: 07/27/2014 13:12   Dg Foot Complete Right  07/30/2014   CLINICAL DATA:  Multiple falls with right foot pain.  EXAM: RIGHT FOOT COMPLETE - 3+ VIEW  COMPARISON:  None.  FINDINGS: Mild diffuse decreased bone mineralization. Persistent flexion at the second proximal interphalangeal joint. No definite acute fracture or dislocation.  IMPRESSION: No acute findings.   Electronically Signed   By: Elberta Fortis M.D.   On: 07/30/2014 13:31   Dg Hip Unilat With Pelvis 2-3 Views Right  07/30/2014   CLINICAL DATA:  Multiple falls. Severe pain. Patient unable to straighten RIGHT leg.  EXAM: RIGHT HIP (WITH PELVIS) 2-3 VIEWS  COMPARISON:  08/20/2011.  FINDINGS: There is no evidence of hip fracture or dislocation. There is no evidence of erosive arthropathy. Moderate callus surrounding RIGHT superior and inferior pubic ramus fractures.  IMPRESSION: Negative for acute abnormality.   Electronically Signed   By: Davonna Belling M.D.   On: 07/30/2014 13:35   Ct Maxillofacial Wo Cm  07/27/2014   CLINICAL DATA:  The patient fell and has right facial trauma with a laceration and facial fractures.  EXAM: CT MAXILLOFACIAL WITHOUT CONTRAST  TECHNIQUE: Multidetector CT imaging of the maxillofacial structures was performed. Multiplanar CT image reconstructions were also generated. A small metallic BB was placed on the right temple in order to reliably differentiate right from left.  COMPARISON:  None.  FINDINGS: There is a tripod fracture of the right facial bones with a slightly displaced fracture of the posterior aspect of the right zygomatic arch, anterior and lateral wall maxillary sinus fractures, nondisplaced hairline fractures through the medial wall of the right maxillary sinus, and minimally depressed fracture of the floor of the right orbit.  The right inferior orbital rim appears to be intact. There is no entrapment of extraocular muscles of the right orbit. There is an blood fluid level in the right maxillary sinus.  Fracture of the lateral wall of the right orbit.  The other facial bones are normal.  Note  is made of severe temporal lobe atrophy.  IMPRESSION: Fractures of the anterior, medial, and posterior lateral walls of the right maxillary sinus as well as of the right zygomatic arch and of the lateral wall of the right orbit. No muscle entrapment.   Electronically Signed   By: Francene Boyers M.D.   On: 07/27/2014 14:42    CBC  Recent Labs Lab 07/27/14 1128 07/27/14 1140 07/30/14 1149  WBC 8.6  --  9.3  HGB 13.5 14.6 12.3  HCT 40.2 43.0 36.1  PLT 170  --  203  MCV 100.5*  --  98.9  MCH 33.8  --  33.7  MCHC 33.6  --  34.1  RDW 12.6  --  12.3  LYMPHSABS 0.9  --  1.1  MONOABS 0.7  --  0.9  EOSABS 0.0  --  0.0  BASOSABS 0.0  --  0.0    Chemistries   Recent Labs Lab 07/27/14 1140 07/30/14 1149  NA 139 139  K 3.7 3.6  CL 97 100  CO2  --  29  GLUCOSE 114* 111*  BUN 6 6  CREATININE 0.60 0.52  CALCIUM  --  9.2   ------------------------------------------------------------------------------------------------------------------ estimated creatinine clearance is 49 mL/min (by C-G formula based on Cr of 0.52). ------------------------------------------------------------------------------------------------------------------ No results for input(s): HGBA1C in the last 72 hours. ------------------------------------------------------------------------------------------------------------------ No results for input(s): CHOL, HDL, LDLCALC, TRIG, CHOLHDL, LDLDIRECT in the last 72 hours. ------------------------------------------------------------------------------------------------------------------ No results for input(s): TSH, T4TOTAL, T3FREE, THYROIDAB in the last 72 hours.  Invalid input(s):  FREET3 ------------------------------------------------------------------------------------------------------------------ No results for input(s): VITAMINB12, FOLATE, FERRITIN, TIBC, IRON, RETICCTPCT in the last 72 hours.  Coagulation profile No results for input(s): INR, PROTIME in the last 168 hours.  No results for input(s): DDIMER in the last 72 hours.  Cardiac Enzymes No results for input(s): CKMB, TROPONINI, MYOGLOBIN in the last 168 hours.  Invalid input(s): CK ------------------------------------------------------------------------------------------------------------------ Invalid input(s): POCBNP  No results for input(s): GLUCAP in the last 72 hours.   Aarav Burgett M.D. [facility] 08/02/2014, 11:53 AM  Pager: [phone]   Between 7am to 7pm - call Pager - [phone]  After 7pm go to [email or web address] - password [email or web address]  Call night coverage person covering after 7pm

## 2014-08-02 NOTE — Social Work (Signed)
CSW provided husband with bed offers to consider. Husband is hoping for more bed offers but will review [address] [facility] [place] and [place] as potential options.   Beverly Sessions MSW, LCSW [phone]

## 2014-08-03 DIAGNOSIS — Z515 Encounter for palliative care: Secondary | ICD-10-CM

## 2014-08-03 MED ORDER — OXYCODONE HCL 20 MG/ML PO CONC
10.0000 mg | ORAL | Status: DC | PRN
  Administered 2014-08-04 – 2014-08-07 (×12): 10 mg via ORAL
  Filled 2014-08-03 (×12): qty 1

## 2014-08-03 MED ORDER — FENTANYL CITRATE (PF) 100 MCG/2ML IJ SOLN
12.5000 ug | Freq: Once | INTRAMUSCULAR | Status: AC
  Administered 2014-08-03: 12.5 ug via INTRAVENOUS
  Filled 2014-08-03: qty 2

## 2014-08-03 MED ORDER — SENNA 8.6 MG PO TABS
2.0000 | ORAL_TABLET | Freq: Every day | ORAL | Status: DC
  Administered 2014-08-03 – 2014-08-04 (×2): 17.2 mg via ORAL
  Filled 2014-08-03 (×3): qty 2

## 2014-08-03 MED ORDER — ACETAMINOPHEN 325 MG PO TABS
650.0000 mg | ORAL_TABLET | Freq: Three times a day (TID) | ORAL | Status: DC
  Administered 2014-08-03 – 2014-08-07 (×12): 650 mg via ORAL
  Filled 2014-08-03 (×12): qty 2

## 2014-08-03 MED ORDER — TRAZODONE 25 MG HALF TABLET
25.0000 mg | ORAL_TABLET | Freq: Every evening | ORAL | Status: DC | PRN
  Filled 2014-08-03: qty 1

## 2014-08-03 MED ORDER — FENTANYL 25 MCG/HR TD PT72
25.0000 ug | MEDICATED_PATCH | TRANSDERMAL | Status: DC
  Administered 2014-08-03: 25 ug via TRANSDERMAL
  Filled 2014-08-03: qty 1

## 2014-08-03 NOTE — Progress Notes (Signed)
[facility]                                                                              Patient Demographics  [name], is a 69 y.o. female, DOB - 1945-11-16, ZOX:[id number]  Admit date - 07/30/2014   Admitting Physician Kathlen Mody, MD  Outpatient Primary MD for the patient is Juline Patch, MD  LOS - 4   Chief Complaint  Patient presents with  . Fall       Brief HPI   [name] is a 69 y.o. female with prior h/o hypothyroidism, RA, had a mechanical fall to the right side on 4.18 and ended up having right superior and inferior pubic ramus fractures, and fractures of the anterior, medial and posterior lateral walls of the right maxillary sinus and of the right zygomatic arch and the lateral wall of the right orbit. She was started on oral hydrocodone and was seen by ENT on 4/18. She was also started on keflex for maxillary sinus wall fractures. But she reported persistent and had to be transferred to ED for pain control. In ED she was given one percocet and referred tom edical service for admission for pain control. On arrival to ED, she was in moderate distress from knee pain, tachycardic and hypertensive. Labs were unremarkable. She will be admitted to m ed surg for pain control.    Assessment & Plan    Principal Problem:   Right knee pain; unclear etiology, patient had a mechanical fall on the right side on 4/18 had had several fractures including right superior and inferior pubic rami fracture and right maxillary sinus fracture, right orbital fracture - X-ray of the right knee did not show any fracture or dislocation - Orthopedics consulted, patient was admitted by Dr. Eulah Pont who recommended nonoperative pain control, no signs of any septic arthritis or fracture. - Pain control still a significant issue, PT evaluation recommended skilled nursing facility  - Palliative medicine consult placed for pain control and goals of care, I have requested again  today   Active Problems:   Hypothyroid Continue Synthroid    Fracture of multiple pubic rami - Nonoperative, PTOT   Depression with dementia - Continue Cymbalta, Aricept  Recent fracture of inferior, medial, posterior lateral walls of right maxilla sinus, right zygomatic arch, lateral wall of the right orbit -Continue Keflex as recommended by ENT    right distal clavicular fracture, subtle nondisplaced fracture of fifth metacarpal distally - Continue shoulder immobilizer for comfort, wrist splint   Code Status: Full code  Family Communication: Discussed in detail with the patient, all imaging results, lab results explained to the patient    Disposition Plan: Currently at [facility] assisted living facility, will need skilled nursing facility, awaiting palliative medicine recommendations regarding pain control, goals of care  Time Spent in minutes  25 minutes  Procedures  None   Consults   Orthopedics  DVT Prophylaxis   Lovenox   Medications  Scheduled Meds: . antiseptic oral rinse  7 mL Mouth Rinse BID  . aspirin  81 mg Oral Daily  . cephALEXin  500 mg Oral TID  . diclofenac  1 patch Transdermal BID  . docusate sodium  200 mg Oral QHS  . donepezil  5 mg Oral QHS  . DULoxetine  60 mg Oral Daily  . enoxaparin (LOVENOX) injection  40 mg Subcutaneous Q24H  . feeding supplement (ENSURE ENLIVE)  237 mL Oral BID BM  . fluticasone  1 spray Each Nare BID  . folic acid  1 mg Oral Daily  . levothyroxine  88 mcg Oral QAC breakfast  . magnesium oxide  200 mg Oral Daily   Continuous Infusions:  PRN Meds:.bisacodyl, fentaNYL (SUBLIMAZE) injection, morphine injection, ondansetron **OR** ondansetron (ZOFRAN) IV, oxyCODONE-acetaminophen, polyethylene glycol   Antibiotics   Anti-infectives    Start     Dose/Rate Route Frequency Ordered Stop   07/30/14 1600  cephALEXin (KEFLEX) capsule 500 mg     500 mg Oral 3 times daily 07/30/14 1523          Subjective:   [name] was seen and examined today. Patient continues to complain of severe pain in her left knee and all over. Denies any chest pain, shortness of breath, abdominal pain, N/V/D/C, new weakness, numbess, tingling.  No fevers or chills. States she feels horrible with pain  Objective:   Blood pressure 134/77, pulse 88, temperature 97.7 F (36.5 C), temperature source Oral, resp. rate 16, height 5\' 4"  (1.626 m), weight 48.081 kg (106 lb), SpO2 99 %.  Wt Readings from Last 3 Encounters:  07/31/14 48.081 kg (106 lb)  08/21/11 61.236 kg (135 lb)     Intake/Output Summary (Last 24 hours) at 08/03/14 1140 Last data filed at 08/03/14 1019  Gross per 24 hour  Intake    240 ml  Output   1195 ml  Net   -955 ml    Exam  General: Alert and oriented,  Uncomfortable, moaning, sometimes difficult to understand  HEENT:  PERRLA, EOMI, Anicteic Sclera, mucous membranes moist.   Neck: Supple, no JVD, no masses  CVS: S1 S2 clear, regular rate and rhythm  Respiratory: Clear to auscultation bilaterally  Abdomen: Soft, nontender, nondistended, + bowel sounds  Ext: no cyanosis clubbing or edema, keeps right knee bent,? Contracture.   Neuro: AAOx3, Cr N's II- XII, unable to examine lower extremities due to lack of patient cooperation and pain  skin: No rashes  Psych: Normal affect and demeanor, alert and oriented    Data Review   Micro Results No results found for this or any previous visit (from the past 240 hour(s)).  Radiology Reports Dg Shoulder Right  07/27/2014   CLINICAL DATA:  Larey Seat this morning, bruising and pain RIGHT shoulder  EXAM: RIGHT SHOULDER - 2+ VIEW  COMPARISON:  None  FINDINGS: Diffuse osseous demineralization.  AC joint alignment normal.  Slight irregularity of the distal clavicle question age-indeterminate distal RIGHT clavicular fracture.  No additional fracture, dislocation or bone destruction.  Visualized RIGHT ribs intact.  IMPRESSION: Osseous demineralization with  questionable age-indeterminate distal RIGHT clavicular fracture.  No additional focal bony abnormality.   Electronically Signed   By: Ulyses Southward M.D.   On: 07/27/2014 13:29   Dg Knee 1-2 Views Right  07/30/2014   CLINICAL DATA:  Acute right knee pain after fall. Initial encounter.  EXAM: RIGHT KNEE - 1-2 VIEW  COMPARISON:  None available currently.  FINDINGS: There is no evidence of fracture, dislocation, or joint effusion. There is no evidence of arthropathy or other focal bone abnormality. Soft tissues are unremarkable.  IMPRESSION: No significant abnormality seen in the right  knee.   Electronically Signed   By: Lupita Raider, M.D.   On: 07/30/2014 13:29   Ct Head Wo Contrast  07/27/2014   CLINICAL DATA:  Fall. Laceration right-sided forehead. Bruising to the right eye. Epistaxis. Initial encounter. Personal history of dementia.  EXAM: CT HEAD WITHOUT CONTRAST  CT CERVICAL SPINE WITHOUT CONTRAST  TECHNIQUE: Multidetector CT imaging of the head and cervical spine was performed following the standard protocol without intravenous contrast. Multiplanar CT image reconstructions of the cervical spine were also generated.  COMPARISON:  MRI brain 04/19/2014  FINDINGS: CT HEAD FINDINGS  There is marked cerebral atrophy particularly involving the parietal and temporal lobes bilaterally. The ventricular system is dilated proportionate to the degree of atrophy. Moderate white matter changes are again seen bilaterally.  No acute infarct, hemorrhage, or mass lesion is present.  A posterolateral right maxillary sinus fracture is present. There is a comminuted fracture involving the anterior wall of the right maxillary sinus is well. An orbital floor fracture is suspected. The fracture extends superiorly to involve the lateral wall of the right orbit. The globe is intact. There is a segmental fracture of the right zygomatic arch.  Limited supraorbital soft tissue swelling is present without additional fractures.  CT  CERVICAL SPINE FINDINGS  Cervical spine is imaged from the skullbase through T1-2. A hard collar is in place. Multilevel degenerative changes are present. Uncovertebral spurring is worse on the left at C3-4, C4-5, and C5-6. Left-sided osseous foraminal narrowing is present at these levels. No acute fracture or traumatic subluxation is present.  IMPRESSION: 1. Stable atrophy and white matter disease. The atrophy pattern is most prominent in the parietal and temporal lobes consistent with an Alzheimer's type pattern. 2. No acute intracranial abnormality. 3. Comminuted right maxillary sinus fracture with the blood layer within the sinus. 4. Suspect right orbital floor fracture. CT of the maxillofacial sinuses would be useful for further evaluation of this type fracture. 5. Comminuted right lateral orbital wall fracture and segmental right zygomatic arch fracture. 6. Degenerative changes of the cervical spine without evidence for acute trauma.   Electronically Signed   By: Marin Roberts M.D.   On: 07/27/2014 12:59   Ct Cervical Spine Wo Contrast  07/27/2014   CLINICAL DATA:  Fall. Laceration right-sided forehead. Bruising to the right eye. Epistaxis. Initial encounter. Personal history of dementia.  EXAM: CT HEAD WITHOUT CONTRAST  CT CERVICAL SPINE WITHOUT CONTRAST  TECHNIQUE: Multidetector CT imaging of the head and cervical spine was performed following the standard protocol without intravenous contrast. Multiplanar CT image reconstructions of the cervical spine were also generated.  COMPARISON:  MRI brain 04/19/2014  FINDINGS: CT HEAD FINDINGS  There is marked cerebral atrophy particularly involving the parietal and temporal lobes bilaterally. The ventricular system is dilated proportionate to the degree of atrophy. Moderate white matter changes are again seen bilaterally.  No acute infarct, hemorrhage, or mass lesion is present.  A posterolateral right maxillary sinus fracture is present. There is a  comminuted fracture involving the anterior wall of the right maxillary sinus is well. An orbital floor fracture is suspected. The fracture extends superiorly to involve the lateral wall of the right orbit. The globe is intact. There is a segmental fracture of the right zygomatic arch.  Limited supraorbital soft tissue swelling is present without additional fractures.  CT CERVICAL SPINE FINDINGS  Cervical spine is imaged from the skullbase through T1-2. A hard collar is in place. Multilevel degenerative changes are present. Uncovertebral  spurring is worse on the left at C3-4, C4-5, and C5-6. Left-sided osseous foraminal narrowing is present at these levels. No acute fracture or traumatic subluxation is present.  IMPRESSION: 1. Stable atrophy and white matter disease. The atrophy pattern is most prominent in the parietal and temporal lobes consistent with an Alzheimer's type pattern. 2. No acute intracranial abnormality. 3. Comminuted right maxillary sinus fracture with the blood layer within the sinus. 4. Suspect right orbital floor fracture. CT of the maxillofacial sinuses would be useful for further evaluation of this type fracture. 5. Comminuted right lateral orbital wall fracture and segmental right zygomatic arch fracture. 6. Degenerative changes of the cervical spine without evidence for acute trauma.   Electronically Signed   By: Marin Roberts M.D.   On: 07/27/2014 12:59   Dg Hand Complete Right  07/27/2014   CLINICAL DATA:  Right hand pain post fall this morning  EXAM: RIGHT HAND - COMPLETE 3+ VIEW  COMPARISON:  07/20/2013  FINDINGS: Three views of the right hand submitted. No displaced fracture or subluxation. There is cortical irregularity distal aspect of fifth metacarpal suspicious for subtle nondisplaced fracture. Clinical correlation is necessary.  IMPRESSION: No displaced fracture or subluxation. There is cortical irregularity distal aspect of fifth metacarpal suspicious for subtle  nondisplaced fracture. Clinical correlation is necessary.   Electronically Signed   By: Natasha Mead M.D.   On: 07/27/2014 13:12   Dg Foot Complete Right  07/30/2014   CLINICAL DATA:  Multiple falls with right foot pain.  EXAM: RIGHT FOOT COMPLETE - 3+ VIEW  COMPARISON:  None.  FINDINGS: Mild diffuse decreased bone mineralization. Persistent flexion at the second proximal interphalangeal joint. No definite acute fracture or dislocation.  IMPRESSION: No acute findings.   Electronically Signed   By: Elberta Fortis M.D.   On: 07/30/2014 13:31   Dg Hip Unilat With Pelvis 2-3 Views Right  07/30/2014   CLINICAL DATA:  Multiple falls. Severe pain. Patient unable to straighten RIGHT leg.  EXAM: RIGHT HIP (WITH PELVIS) 2-3 VIEWS  COMPARISON:  08/20/2011.  FINDINGS: There is no evidence of hip fracture or dislocation. There is no evidence of erosive arthropathy. Moderate callus surrounding RIGHT superior and inferior pubic ramus fractures.  IMPRESSION: Negative for acute abnormality.   Electronically Signed   By: Davonna Belling M.D.   On: 07/30/2014 13:35   Ct Maxillofacial Wo Cm  07/27/2014   CLINICAL DATA:  The patient fell and has right facial trauma with a laceration and facial fractures.  EXAM: CT MAXILLOFACIAL WITHOUT CONTRAST  TECHNIQUE: Multidetector CT imaging of the maxillofacial structures was performed. Multiplanar CT image reconstructions were also generated. A small metallic BB was placed on the right temple in order to reliably differentiate right from left.  COMPARISON:  None.  FINDINGS: There is a tripod fracture of the right facial bones with a slightly displaced fracture of the posterior aspect of the right zygomatic arch, anterior and lateral wall maxillary sinus fractures, nondisplaced hairline fractures through the medial wall of the right maxillary sinus, and minimally depressed fracture of the floor of the right orbit. The right inferior orbital rim appears to be intact. There is no entrapment of  extraocular muscles of the right orbit. There is an blood fluid level in the right maxillary sinus.  Fracture of the lateral wall of the right orbit.  The other facial bones are normal.  Note is made of severe temporal lobe atrophy.  IMPRESSION: Fractures of the anterior, medial, and posterior lateral  walls of the right maxillary sinus as well as of the right zygomatic arch and of the lateral wall of the right orbit. No muscle entrapment.   Electronically Signed   By: Francene Boyers M.D.   On: 07/27/2014 14:42    CBC  Recent Labs Lab 07/30/14 1149  WBC 9.3  HGB 12.3  HCT 36.1  PLT 203  MCV 98.9  MCH 33.7  MCHC 34.1  RDW 12.3  LYMPHSABS 1.1  MONOABS 0.9  EOSABS 0.0  BASOSABS 0.0    Chemistries   Recent Labs Lab 07/30/14 1149  NA 139  K 3.6  CL 100  CO2 29  GLUCOSE 111*  BUN 6  CREATININE 0.52  CALCIUM 9.2   ------------------------------------------------------------------------------------------------------------------ estimated creatinine clearance is 49 mL/min (by C-G formula based on Cr of 0.52). ------------------------------------------------------------------------------------------------------------------ No results for input(s): HGBA1C in the last 72 hours. ------------------------------------------------------------------------------------------------------------------ No results for input(s): CHOL, HDL, LDLCALC, TRIG, CHOLHDL, LDLDIRECT in the last 72 hours. ------------------------------------------------------------------------------------------------------------------ No results for input(s): TSH, T4TOTAL, T3FREE, THYROIDAB in the last 72 hours.  Invalid input(s): FREET3 ------------------------------------------------------------------------------------------------------------------ No results for input(s): VITAMINB12, FOLATE, FERRITIN, TIBC, IRON, RETICCTPCT in the last 72 hours.  Coagulation profile No results for input(s): INR, PROTIME in the last  168 hours.  No results for input(s): DDIMER in the last 72 hours.  Cardiac Enzymes No results for input(s): CKMB, TROPONINI, MYOGLOBIN in the last 168 hours.  Invalid input(s): CK ------------------------------------------------------------------------------------------------------------------ Invalid input(s): POCBNP  No results for input(s): GLUCAP in the last 72 hours.   Rahima Fleishman M.D. [facility] 08/03/2014, 11:40 AM  Pager: [phone]   Between 7am to 7pm - call Pager - [phone]  After 7pm go to [email or web address] - password [email or web address]  Call night coverage person covering after 7pm

## 2014-08-03 NOTE — Consult Note (Signed)
Palliative Medicine Consult  69 yo with multiple fractures following a fall-palliative consult for GOC received 4/23 for goals of care and symptom management. Received multiple calls X5 to assist with patient's progression in terms of disposition but when I arrived to the patient's room it was very clear that her priority isn't progression but significantly uncontrolled pain-she was moaning and asking for pain medication-she complained of pain in her right arm which has not been imaged- she has limited mobility in that arm but unclear of her baseline. Additionally she complained of pain in her pelvis and bilateral knees. She is extremely frail and osteoporotic. Orthopedics have recommended non-operative management of her fractures- unclear if anyone has addressed goals of care or illness trajectory with patient's spouse or family. She is listed as a FULL CODE but unclear if this is just by default.  Recommendations:  1. Pain Control:  -Currently she is on 103mg  oral Morphine equivalents in 24 hours and still having 10/10 pain control- her RN Assessments and MAR support this isnt helping or effective. Will transition her to a Fentanyl Patch at 25 mcg/hr (quivalent would be 40 but I will reduce for cross tolerance). Will use Oxyfast 10mg  q4 for breakthrough pain- will take 12-24 hours for her TD Fentanyl to take effect- this should be titrated no sooner than 3 days after placement. Fentanyl for IV back up while inpatient.  2. Progress when she is on a reasonable pain regimen that can be done at SNF to avoid readmission.  3. This patient has not been on a scheduled stimulant laxative since admission receiving high doses of opiates-no BM in 4 days-her RN yesterday afternoon did recognize this and gave her 1 dose of Miralax a PRN order-but she has had no response to that. She probably needs to have a BM before discharge. I have written for scheduled laxative. Give Dulcolax suppository X1 now-repeat if  needed.  4. Husband not at bedside but it seems next step would be SNF-we need to complete a MOST and talk about code status, prognosis, desire for readmission and Hospice as an option. I will call him and connect with him today.   Palliative will follow- unfortunately I cannot support her progression/discharge today given her current condition.  , DO Palliative Medicine [phone]

## 2014-08-03 NOTE — Care Management Note (Addendum)
    Page 1 of 1   08/07/2014     2:24:51 PM CARE MANAGEMENT NOTE 08/07/2014  Patient:  [name], [name]   Account Number:  [id number]  Date Initiated:  08/03/2014  Documentation initiated by:  Lawerance Sabal  Subjective/Objective Assessment:   Right Knee pain, palliative consult from [facility]     Action/Plan:   will follow for dc needs   Anticipated DC Date:  08/03/2014   Anticipated DC Plan:  SKILLED NURSING FACILITY  In-house referral  Clinical Social Worker      DC Planning Services  CM consult      Choice offered to / List presented to:             Status of service:  Completed, signed off Medicare Important Message given?  YES (If response is "NO", the following Medicare IM given date fields will be blank) Date Medicare IM given:  08/03/2014 Medicare IM given by:  Lawerance Sabal Date Additional Medicare IM given:  08/06/2014 Additional Medicare IM given by:  [facility] SWIST  Discharge Disposition:  SKILLED NURSING FACILITY  Per UR Regulation:  Reviewed for med. necessity/level of care/duration of stay  If discussed at Long Length of Stay Meetings, dates discussed:    Comments:  08-07-14 DC to SNF [employer] RN BSN CM  4-28 IM letter given Lawerance Sabal RN BSN CM   08-03-14 No neds at this time. IM letter given Lawerance Sabal RN BSN CM

## 2014-08-04 ENCOUNTER — Inpatient Hospital Stay (HOSPITAL_COMMUNITY): Payer: Medicare PPO

## 2014-08-04 DIAGNOSIS — Z7409 Other reduced mobility: Secondary | ICD-10-CM | POA: Diagnosis present

## 2014-08-04 DIAGNOSIS — M069 Rheumatoid arthritis, unspecified: Secondary | ICD-10-CM | POA: Diagnosis present

## 2014-08-04 DIAGNOSIS — Z789 Other specified health status: Secondary | ICD-10-CM

## 2014-08-04 DIAGNOSIS — M810 Age-related osteoporosis without current pathological fracture: Secondary | ICD-10-CM | POA: Diagnosis present

## 2014-08-04 DIAGNOSIS — F0150 Vascular dementia without behavioral disturbance: Secondary | ICD-10-CM | POA: Diagnosis present

## 2014-08-04 DIAGNOSIS — N390 Urinary tract infection, site not specified: Secondary | ICD-10-CM | POA: Diagnosis present

## 2014-08-04 DIAGNOSIS — E43 Unspecified severe protein-calorie malnutrition: Secondary | ICD-10-CM | POA: Diagnosis present

## 2014-08-04 LAB — MRSA PCR SCREENING: MRSA by PCR: NEGATIVE

## 2014-08-04 MED ORDER — METHOCARBAMOL 500 MG PO TABS
500.0000 mg | ORAL_TABLET | Freq: Three times a day (TID) | ORAL | Status: DC
  Administered 2014-08-04 – 2014-08-07 (×8): 500 mg via ORAL
  Filled 2014-08-04 (×10): qty 1

## 2014-08-04 MED ORDER — DIAZEPAM 5 MG/ML IJ SOLN
2.5000 mg | Freq: Once | INTRAMUSCULAR | Status: AC
  Administered 2014-08-04: 2.5 mg via INTRAVENOUS
  Filled 2014-08-04: qty 2

## 2014-08-04 MED ORDER — FENTANYL CITRATE (PF) 100 MCG/2ML IJ SOLN
25.0000 ug | INTRAMUSCULAR | Status: DC | PRN
  Administered 2014-08-04 – 2014-08-05 (×3): 25 ug via INTRAVENOUS
  Filled 2014-08-04 (×4): qty 2

## 2014-08-04 MED ORDER — FENTANYL CITRATE (PF) 100 MCG/2ML IJ SOLN
50.0000 ug | Freq: Once | INTRAMUSCULAR | Status: AC
  Administered 2014-08-04: 50 ug via INTRAVENOUS
  Filled 2014-08-04: qty 2

## 2014-08-04 NOTE — Progress Notes (Signed)
[facility]                                                                              Patient Demographics  [name], is a 69 y.o. female, DOB - 1946-03-20, SKS:[id number]  Admit date - 07/30/2014   Admitting Physician Kathlen Mody, MD  Outpatient Primary MD for the patient is Juline Patch, MD  LOS - 5   Chief Complaint  Patient presents with  . Fall       Brief HPI   [name] is a 69 y.o. female with prior h/o hypothyroidism, RA, had a mechanical fall to the right side on 4.18 and ended up having right superior and inferior pubic ramus fractures, and fractures of the anterior, medial and posterior lateral walls of the right maxillary sinus and of the right zygomatic arch and the lateral wall of the right orbit. She was started on oral hydrocodone and was seen by ENT on 4/18. She was also started on keflex for maxillary sinus wall fractures. But she reported persistent and had to be transferred to ED for pain control. In ED she was given one percocet and referred tom edical service for admission for pain control. On arrival to ED, she was in moderate distress from knee pain, tachycardic and hypertensive. Labs were unremarkable. She will be admitted to m ed surg for pain control.    Assessment & Plan    Principal Problem:   Right knee pain; unclear etiology, patient had a mechanical fall on the right side on 4/18 had had several fractures including right superior and inferior pubic rami fracture and right maxillary sinus fracture, right orbital fracture - X-ray of the right knee did not show any fracture or dislocation - Orthopedics consulted, patient was admitted by Dr. Eulah Pont who recommended nonoperative pain control, no signs of any septic arthritis or fracture. - Pain control somewhat better today, appreciate palliative medicine, Dr. Lamar Blinks assistance -  PT evaluation recommended skilled nursing facility   Active Problems:    Hypothyroid Continue Synthroid    Fracture of multiple pubic rami - Nonoperative, PTOT   Depression with dementia - Continue Cymbalta, Aricept  Recent fracture of inferior, medial, posterior lateral walls of right maxilla sinus, right zygomatic arch, lateral wall of the right orbit - Continue Keflex as recommended by ENT    right distal clavicular fracture, subtle nondisplaced fracture of fifth metacarpal distally - Continue shoulder immobilizer for comfort, wrist splint   Code Status: Full code  Family Communication: Discussed in detail with the patient, all imaging results, lab results explained to the patient and her husband in detail   Disposition Plan: Skilled nursing facility, hopefully next 24-48 hours  Time Spent in minutes  25 minutes  Procedures  None   Consults   Orthopedics  DVT Prophylaxis   Lovenox   Medications  Scheduled Meds: . acetaminophen  650 mg Oral TID  . antiseptic oral rinse  7 mL Mouth Rinse BID  . aspirin  81 mg Oral Daily  . cephALEXin  500 mg Oral TID  . diclofenac  1 patch Transdermal BID  . docusate sodium  200 mg  Oral QHS  . donepezil  5 mg Oral QHS  . DULoxetine  60 mg Oral Daily  . enoxaparin (LOVENOX) injection  40 mg Subcutaneous Q24H  . feeding supplement (ENSURE ENLIVE)  237 mL Oral BID BM  . fentaNYL  25 mcg Transdermal Q72H  . fluticasone  1 spray Each Nare BID  . folic acid  1 mg Oral Daily  . levothyroxine  88 mcg Oral QAC breakfast  . magnesium oxide  200 mg Oral Daily  . senna  2 tablet Oral QHS   Continuous Infusions:  PRN Meds:.bisacodyl, ondansetron **OR** ondansetron (ZOFRAN) IV, oxyCODONE, polyethylene glycol, traZODone   Antibiotics   Anti-infectives    Start     Dose/Rate Route Frequency Ordered Stop   07/30/14 1600  cephALEXin (KEFLEX) capsule 500 mg     500 mg Oral 3 times daily 07/30/14 1523          Subjective:   [name] was seen and examined today. Pain control much better today, however  frustrated due to bowel movement while in bed. Husband at bedside. States her knee is feeling better. Denies any chest pain, shortness of breath, abdominal pain, N/V/D/C, new weakness, numbess, tingling.   Objective:   Blood pressure 152/78, pulse 99, temperature 98 F (36.7 C), temperature source Oral, resp. rate 17, height  (1.626 m), weight 48.081 kg (106 lb), SpO2 98 %.  Wt Readings from Last 3 Encounters:  07/31/14 48.081 kg (106 lb)  08/21/11 61.236 kg (135 lb)     Intake/Output Summary (Last 24 hours) at 08/04/14 1404 Last data filed at 08/04/14 7829  Gross per 24 hour  Intake    240 ml  Output    200 ml  Net     40 ml    Exam  General: Alert and oriented  HEENT:  PERRLA, EOMI, Anicteic Sclera, mucous membranes moist.   Neck: Supple, no JVD, no masses  CVS: S1 S2 clear, RRR  Respiratory: CTAB  Abdomen: Soft, NT, ND, NBS  Ext: no cyanosis clubbing or edema, keeps right knee bent,? Contracture.   Neuro: AAOx3, Cr N's II- XII  skin: No rashes  Psych: Normal affect and demeanor, alert and oriented    Data Review   Micro Results Recent Results (from the past 240 hour(s))  MRSA PCR Screening     Status: None   Collection Time: 08/04/14 11:30 AM  Result Value Ref Range Status   MRSA by PCR NEGATIVE NEGATIVE Final    Comment:        The GeneXpert MRSA Assay (FDA approved for NASAL specimens only), is one component of a comprehensive MRSA colonization surveillance program. It is not intended to diagnose MRSA infection nor to guide or monitor treatment for MRSA infections.     Radiology Reports Dg Shoulder Right  07/27/2014   CLINICAL DATA:  Larey Seat this morning, bruising and pain RIGHT shoulder  EXAM: RIGHT SHOULDER - 2+ VIEW  COMPARISON:  None  FINDINGS: Diffuse osseous demineralization.  AC joint alignment normal.  Slight irregularity of the distal clavicle question age-indeterminate distal RIGHT clavicular fracture.  No additional fracture,  dislocation or bone destruction.  Visualized RIGHT ribs intact.  IMPRESSION: Osseous demineralization with questionable age-indeterminate distal RIGHT clavicular fracture.  No additional focal bony abnormality.   Electronically Signed   By: Ulyses Southward M.D.   On: 07/27/2014 13:29   Dg Knee 1-2 Views Right  07/30/2014   CLINICAL DATA:  Acute right knee pain after fall. Initial  encounter.  EXAM: RIGHT KNEE - 1-2 VIEW  COMPARISON:  None available currently.  FINDINGS: There is no evidence of fracture, dislocation, or joint effusion. There is no evidence of arthropathy or other focal bone abnormality. Soft tissues are unremarkable.  IMPRESSION: No significant abnormality seen in the right knee.   Electronically Signed   By: Lupita Raider, M.D.   On: 07/30/2014 13:29   Ct Head Wo Contrast  07/27/2014   CLINICAL DATA:  Fall. Laceration right-sided forehead. Bruising to the right eye. Epistaxis. Initial encounter. Personal history of dementia.  EXAM: CT HEAD WITHOUT CONTRAST  CT CERVICAL SPINE WITHOUT CONTRAST  TECHNIQUE: Multidetector CT imaging of the head and cervical spine was performed following the standard protocol without intravenous contrast. Multiplanar CT image reconstructions of the cervical spine were also generated.  COMPARISON:  MRI brain 04/19/2014  FINDINGS: CT HEAD FINDINGS  There is marked cerebral atrophy particularly involving the parietal and temporal lobes bilaterally. The ventricular system is dilated proportionate to the degree of atrophy. Moderate white matter changes are again seen bilaterally.  No acute infarct, hemorrhage, or mass lesion is present.  A posterolateral right maxillary sinus fracture is present. There is a comminuted fracture involving the anterior wall of the right maxillary sinus is well. An orbital floor fracture is suspected. The fracture extends superiorly to involve the lateral wall of the right orbit. The globe is intact. There is a segmental fracture of the right  zygomatic arch.  Limited supraorbital soft tissue swelling is present without additional fractures.  CT CERVICAL SPINE FINDINGS  Cervical spine is imaged from the skullbase through T1-2. A hard collar is in place. Multilevel degenerative changes are present. Uncovertebral spurring is worse on the left at C3-4, C4-5, and C5-6. Left-sided osseous foraminal narrowing is present at these levels. No acute fracture or traumatic subluxation is present.  IMPRESSION: 1. Stable atrophy and white matter disease. The atrophy pattern is most prominent in the parietal and temporal lobes consistent with an Alzheimer's type pattern. 2. No acute intracranial abnormality. 3. Comminuted right maxillary sinus fracture with the blood layer within the sinus. 4. Suspect right orbital floor fracture. CT of the maxillofacial sinuses would be useful for further evaluation of this type fracture. 5. Comminuted right lateral orbital wall fracture and segmental right zygomatic arch fracture. 6. Degenerative changes of the cervical spine without evidence for acute trauma.   Electronically Signed   By: Marin Roberts M.D.   On: 07/27/2014 12:59   Ct Cervical Spine Wo Contrast  07/27/2014   CLINICAL DATA:  Fall. Laceration right-sided forehead. Bruising to the right eye. Epistaxis. Initial encounter. Personal history of dementia.  EXAM: CT HEAD WITHOUT CONTRAST  CT CERVICAL SPINE WITHOUT CONTRAST  TECHNIQUE: Multidetector CT imaging of the head and cervical spine was performed following the standard protocol without intravenous contrast. Multiplanar CT image reconstructions of the cervical spine were also generated.  COMPARISON:  MRI brain 04/19/2014  FINDINGS: CT HEAD FINDINGS  There is marked cerebral atrophy particularly involving the parietal and temporal lobes bilaterally. The ventricular system is dilated proportionate to the degree of atrophy. Moderate white matter changes are again seen bilaterally.  No acute infarct, hemorrhage,  or mass lesion is present.  A posterolateral right maxillary sinus fracture is present. There is a comminuted fracture involving the anterior wall of the right maxillary sinus is well. An orbital floor fracture is suspected. The fracture extends superiorly to involve the lateral wall of the right orbit. The globe  is intact. There is a segmental fracture of the right zygomatic arch.  Limited supraorbital soft tissue swelling is present without additional fractures.  CT CERVICAL SPINE FINDINGS  Cervical spine is imaged from the skullbase through T1-2. A hard collar is in place. Multilevel degenerative changes are present. Uncovertebral spurring is worse on the left at C3-4, C4-5, and C5-6. Left-sided osseous foraminal narrowing is present at these levels. No acute fracture or traumatic subluxation is present.  IMPRESSION: 1. Stable atrophy and white matter disease. The atrophy pattern is most prominent in the parietal and temporal lobes consistent with an Alzheimer's type pattern. 2. No acute intracranial abnormality. 3. Comminuted right maxillary sinus fracture with the blood layer within the sinus. 4. Suspect right orbital floor fracture. CT of the maxillofacial sinuses would be useful for further evaluation of this type fracture. 5. Comminuted right lateral orbital wall fracture and segmental right zygomatic arch fracture. 6. Degenerative changes of the cervical spine without evidence for acute trauma.   Electronically Signed   By: Marin Roberts M.D.   On: 07/27/2014 12:59   Dg Hand Complete Right  07/27/2014   CLINICAL DATA:  Right hand pain post fall this morning  EXAM: RIGHT HAND - COMPLETE 3+ VIEW  COMPARISON:  07/20/2013  FINDINGS: Three views of the right hand submitted. No displaced fracture or subluxation. There is cortical irregularity distal aspect of fifth metacarpal suspicious for subtle nondisplaced fracture. Clinical correlation is necessary.  IMPRESSION: No displaced fracture or  subluxation. There is cortical irregularity distal aspect of fifth metacarpal suspicious for subtle nondisplaced fracture. Clinical correlation is necessary.   Electronically Signed   By: Natasha Mead M.D.   On: 07/27/2014 13:12   Dg Foot Complete Right  07/30/2014   CLINICAL DATA:  Multiple falls with right foot pain.  EXAM: RIGHT FOOT COMPLETE - 3+ VIEW  COMPARISON:  None.  FINDINGS: Mild diffuse decreased bone mineralization. Persistent flexion at the second proximal interphalangeal joint. No definite acute fracture or dislocation.  IMPRESSION: No acute findings.   Electronically Signed   By: Elberta Fortis M.D.   On: 07/30/2014 13:31   Dg Hip Unilat With Pelvis 2-3 Views Right  07/30/2014   CLINICAL DATA:  Multiple falls. Severe pain. Patient unable to straighten RIGHT leg.  EXAM: RIGHT HIP (WITH PELVIS) 2-3 VIEWS  COMPARISON:  08/20/2011.  FINDINGS: There is no evidence of hip fracture or dislocation. There is no evidence of erosive arthropathy. Moderate callus surrounding RIGHT superior and inferior pubic ramus fractures.  IMPRESSION: Negative for acute abnormality.   Electronically Signed   By: Davonna Belling M.D.   On: 07/30/2014 13:35   Ct Maxillofacial Wo Cm  07/27/2014   CLINICAL DATA:  The patient fell and has right facial trauma with a laceration and facial fractures.  EXAM: CT MAXILLOFACIAL WITHOUT CONTRAST  TECHNIQUE: Multidetector CT imaging of the maxillofacial structures was performed. Multiplanar CT image reconstructions were also generated. A small metallic BB was placed on the right temple in order to reliably differentiate right from left.  COMPARISON:  None.  FINDINGS: There is a tripod fracture of the right facial bones with a slightly displaced fracture of the posterior aspect of the right zygomatic arch, anterior and lateral wall maxillary sinus fractures, nondisplaced hairline fractures through the medial wall of the right maxillary sinus, and minimally depressed fracture of the floor  of the right orbit. The right inferior orbital rim appears to be intact. There is no entrapment of extraocular muscles of  the right orbit. There is an blood fluid level in the right maxillary sinus.  Fracture of the lateral wall of the right orbit.  The other facial bones are normal.  Note is made of severe temporal lobe atrophy.  IMPRESSION: Fractures of the anterior, medial, and posterior lateral walls of the right maxillary sinus as well as of the right zygomatic arch and of the lateral wall of the right orbit. No muscle entrapment.   Electronically Signed   By: Francene Boyers M.D.   On: 07/27/2014 14:42    CBC  Recent Labs Lab 07/30/14 1149  WBC 9.3  HGB 12.3  HCT 36.1  PLT 203  MCV 98.9  MCH 33.7  MCHC 34.1  RDW 12.3  LYMPHSABS 1.1  MONOABS 0.9  EOSABS 0.0  BASOSABS 0.0    Chemistries   Recent Labs Lab 07/30/14 1149  NA 139  K 3.6  CL 100  CO2 29  GLUCOSE 111*  BUN 6  CREATININE 0.52  CALCIUM 9.2   ------------------------------------------------------------------------------------------------------------------ estimated creatinine clearance is 49 mL/min (by C-G formula based on Cr of 0.52). ------------------------------------------------------------------------------------------------------------------ No results for input(s): HGBA1C in the last 72 hours. ------------------------------------------------------------------------------------------------------------------ No results for input(s): CHOL, HDL, LDLCALC, TRIG, CHOLHDL, LDLDIRECT in the last 72 hours. ------------------------------------------------------------------------------------------------------------------ No results for input(s): TSH, T4TOTAL, T3FREE, THYROIDAB in the last 72 hours.  Invalid input(s): FREET3 ------------------------------------------------------------------------------------------------------------------ No results for input(s): VITAMINB12, FOLATE, FERRITIN, TIBC, IRON, RETICCTPCT  in the last 72 hours.  Coagulation profile No results for input(s): INR, PROTIME in the last 168 hours.  No results for input(s): DDIMER in the last 72 hours.  Cardiac Enzymes No results for input(s): CKMB, TROPONINI, MYOGLOBIN in the last 168 hours.  Invalid input(s): CK ------------------------------------------------------------------------------------------------------------------ Invalid input(s): POCBNP  No results for input(s): GLUCAP in the last 72 hours.   Macoy Rodwell M.D. [facility] 08/04/2014, 2:04 PM  Pager: [phone]   Between 7am to 7pm - call Pager - [phone]  After 7pm go to [email or web address] - password [email or web address]  Call night coverage person covering after 7pm

## 2014-08-04 NOTE — Progress Notes (Signed)
Physical Therapy Treatment Patient Details Name: [name] MRN: [id number] DOB: 10-02-1945 Today's Date: 08/04/2014    History of Present Illness Pt is 69 y/o female with hypothyroidism, RA, had a mechanical fall to the right side on 07/27/14 resulting in right superior and inferior pubic ramus fractures, and fractures of the anterior, medial and posterior lateral walls of the right maxillary sinus and of the right zygomatic arch and the lateral wall of the right orbit.  Pt with right UE in sling with the following right distal clavicular fracture, subtle nondisplaced fracture of fifth metacarpal distally.      PT Comments    Continued poor tolerance to therapy, but likely better with new pain medication regimen as she seemed comfortable in bed.  Still recommending SNF rehab at d/c.  Follow Up Recommendations  SNF     Equipment Recommendations  None recommended by PT    Recommendations for Other Services       Precautions / Restrictions Precautions Precautions: Fall Restrictions RUE Weight Bearing: Non weight bearing    Mobility  Bed Mobility Overal bed mobility: Needs Assistance       Supine to sit: Mod assist Sit to supine: Max assist   General bed mobility comments: assist to lift trunk upright, pt constantly pulling in right knee reflexively  Transfers Overall transfer level: Needs assistance Equipment used: 1 person hand held assist Transfers: Sit to/from Stand Sit to Stand: Mod assist;Max assist         General transfer comment: stood briefly by bedside to change soiled linens (about 3-5 seconds)  Ambulation/Gait                 Stairs            Wheelchair Mobility    Modified Rankin (Stroke Patients Only)       Balance Overall balance assessment: Needs assistance;History of Falls Sitting-balance support: Feet supported;Single extremity supported Sitting balance-Leahy Scale: Poor Sitting balance - Comments: sat edge of bed  close supervision, minguard assist for balance about 10 minutes while attempting to convince patient was good for her to sit up; argued we only wanted to hurt her     Standing balance-Leahy Scale: Zero Standing balance comment: needs assist even for brief moment to stand                    Cognition Arousal/Alertness: Awake/alert Behavior During Therapy: Agitated Overall Cognitive Status: No family/caregiver present to determine baseline cognitive functioning Area of Impairment: Orientation Orientation Level: Place;Time;Situation   Memory: Decreased short-term memory         General Comments: Pt limited due to overall cognitive impairment with anxiety and restlessness limiting evaluation as well as pain limiting overall functional mobility.     Exercises      General Comments General comments (skin integrity, edema, etc.): noted lidoderm patches on knee and arm,  She complained of feeling very unstable sitting at edge of bed even though she was well supported sitting on bed and with me sitting right in front of her; she also stated feeling pain in left thigh, right knee and right arm as well as c/o nausea; work to calm her with soothing tone, reassuring her purpose for helping her mobilize as well as to get clean linens.      Pertinent Vitals/Pain Pain Assessment: Faces Faces Pain Scale: Hurts whole lot Pain Location: right knee with movement and weight bearing Pain Intervention(s): Limited activity within patient's tolerance;Monitored during session;Repositioned  Home Living                      Prior Function            PT Goals (current goals can now be found in the care plan section) Progress towards PT goals: Progressing toward goals    Frequency  Min 3X/week    PT Plan Current plan remains appropriate    Co-evaluation             End of Session   Activity Tolerance: Patient limited by pain;Treatment limited secondary to  agitation Patient left: in bed;with call bell/phone within reach;with bed alarm set     Time: 1340-1400 PT Time Calculation (min) (ACUTE ONLY): 20 min  Charges:  $Therapeutic Activity: 8-22 mins                    G Codes:      [name],CYNDI 2014-08-16, 2:52 PM Sheran Lawless, PT [phone] 08-16-14

## 2014-08-04 NOTE — Progress Notes (Addendum)
Patient continued to have severe pain in her right knee characterized by severe spasming and retraction of her hip flexors- her right leg actually looks shorter than her left but a complete exam is limited by pain- she is yelling out and requiring IV medication for pain. I met with her husband this evening to address goals of care-he is reasonable and tells me his wife has been dying for over a year-he tells me she has lost a tremendous amount of weight 145lb-->105lbs and developed significant signs of dementia and debility- she is mostly wheelchair bound at [facility]. She feels most at home at [facility] and her husband is very pleased with the care there. I discussed the very poor prognosis and high mortality associated with frailty and fractures in dementia- he desires comfort and QOL, confirms DNR-he wanted to see imaging- but I could not find any recent imaging of her hips or pelvis for this admission only her knee- I am ordering a CT of her Pelvis- we need to know what we are dealing with even in teh case of complete non-operative man gement. I recommended hospice care and he completely agrees to this level of care- he is not sure he wants her to go to rehab in this much pain and had rather focus on comfort- he doesn't feel that she can rehab-and I agree.  -I will await CT results -I added on IV pain control and IV diazepam for acute muscle spasm -Needs hospice referral for discharge back to [facility]-he can pay for a step up in her care- she has a hospital bed etc..   Time: 7PM-735PM Total Time: 35 minutes Greater than 50%  of this time was spent counseling and coordinating care related to the above assessment and plan.    Mary Golding, DO Palliative Medicine [phone] 

## 2014-08-04 NOTE — Clinical Social Work Note (Signed)
Clinical Social Worker continuing to follow patient and family for support and discharge planning needs.  Patient husband has chosen [facility] authorization was initiated today (4/26).  CSW spoke with facility regarding patient sitter - sitter to be discharged for 24 hours, unit aware.  Facility agreeable with patient discharge tomorrow with no sitter at bedside and insurance authorization.  Per chart, patient husband to meet with Palliative medicine MD to clarify MOST form and GOC.  CSW remains available for support and to facilitate patient discharge needs once medically stable.  Macario Golds, [place] [phone]

## 2014-08-05 DIAGNOSIS — S32000A Wedge compression fracture of unspecified lumbar vertebra, initial encounter for closed fracture: Secondary | ICD-10-CM

## 2014-08-05 DIAGNOSIS — R410 Disorientation, unspecified: Secondary | ICD-10-CM | POA: Insufficient documentation

## 2014-08-05 DIAGNOSIS — Z515 Encounter for palliative care: Secondary | ICD-10-CM | POA: Insufficient documentation

## 2014-08-05 DIAGNOSIS — S32000S Wedge compression fracture of unspecified lumbar vertebra, sequela: Secondary | ICD-10-CM

## 2014-08-05 DIAGNOSIS — K5900 Constipation, unspecified: Secondary | ICD-10-CM | POA: Insufficient documentation

## 2014-08-05 MED ORDER — HALOPERIDOL LACTATE 2 MG/ML PO CONC
1.0000 mg | Freq: Four times a day (QID) | ORAL | Status: DC | PRN
  Filled 2014-08-05: qty 1

## 2014-08-05 MED ORDER — FENTANYL 12 MCG/HR TD PT72: 37.5000 ug | MEDICATED_PATCH | TRANSDERMAL | Status: DC

## 2014-08-05 MED ORDER — OLANZAPINE 5 MG PO TABS
5.0000 mg | ORAL_TABLET | Freq: Every day | ORAL | Status: DC
  Administered 2014-08-05 – 2014-08-06 (×2): 5 mg via ORAL
  Filled 2014-08-05 (×3): qty 1

## 2014-08-05 MED ORDER — SORBITOL 70 % SOLN
960.0000 mL | TOPICAL_OIL | Freq: Once | ORAL | Status: AC
  Administered 2014-08-05: 960 mL via RECTAL
  Filled 2014-08-05: qty 240

## 2014-08-05 MED ORDER — BISACODYL 5 MG PO TBEC
5.0000 mg | DELAYED_RELEASE_TABLET | Freq: Every day | ORAL | Status: DC
  Administered 2014-08-05 – 2014-08-07 (×3): 5 mg via ORAL
  Filled 2014-08-05 (×4): qty 1

## 2014-08-05 MED ORDER — FLEET ENEMA 7-19 GM/118ML RE ENEM
1.0000 | ENEMA | Freq: Once | RECTAL | Status: AC
  Administered 2014-08-05: 1 via RECTAL
  Filled 2014-08-05: qty 1

## 2014-08-05 MED ORDER — SENNA 8.6 MG PO TABS
2.0000 | ORAL_TABLET | Freq: Two times a day (BID) | ORAL | Status: DC
  Administered 2014-08-05 – 2014-08-07 (×4): 17.2 mg via ORAL
  Filled 2014-08-05 (×5): qty 2

## 2014-08-05 NOTE — Progress Notes (Signed)
[facility]                                                                              Patient Demographics  [name], is a 69 y.o. female, DOB - [birth year], [id number]  Admit date - 07/30/2014   Admitting Physician Kathlen Mody, MD  Outpatient Primary MD for the patient is Juline Patch, MD  LOS - 6   Chief Complaint  Patient presents with  . Fall       Brief HPI   [name] is a 69 y.o. female with prior h/o hypothyroidism, RA, had a mechanical fall to the right side on 4.18 and ended up having right superior and inferior pubic ramus fractures, and fractures of the anterior, medial and posterior lateral walls of the right maxillary sinus and of the right zygomatic arch and the lateral wall of the right orbit. She was started on oral hydrocodone and was seen by ENT on 4/18. She was also started on keflex for maxillary sinus wall fractures. But she reported persistent and had to be transferred to ED for pain control. In ED she was given one percocet and referred tom edical service for admission for pain control. On arrival to ED, she was in moderate distress from knee pain, tachycardic and hypertensive. Labs were unremarkable. She was admitted to m ed surg for pain control.    Assessment & Plan      Right knee pain; unclear etiology, patient had a mechanical fall on the right side on 4/18 had had several fractures including right superior and inferior pubic rami fracture and right maxillary sinus fracture, right orbital fracture - X-ray of the right knee did not show any fracture or dislocation - Orthopedics consulted, patient was admitted by Dr. Eulah Pont who recommended nonoperative pain control, no signs of any septic arthritis or fracture. - Pain control: appreciate palliative medicine -  PT evaluation recommended skilled nursing facility   Compression fx, L5 -not sure this is causing her pain -asked IR to see  delirium -appreciate palliative  care    Hypothyroid Continue Synthroid    Fracture of multiple pubic rami - Nonoperative, PTOT   Depression with dementia - Continue Cymbalta, Aricept  Recent fracture of inferior, medial, posterior lateral walls of right maxilla sinus, right zygomatic arch, lateral wall of the right orbit - Continue Keflex as recommended by ENT    right distal clavicular fracture, subtle nondisplaced fracture of fifth metacarpal distally - Continue shoulder immobilizer for comfort, wrist splint  Husband would like to make her comfort care and for her to be able to return to [facility] [employer] [facility] [employer] Code Status: DNR  Family Communication: husband  Disposition Plan:   Time Spent in minutes  25 minutes  Procedures  None   Consults   Orthopedics  DVT Prophylaxis   Lovenox   Medications  Scheduled Meds: . acetaminophen  650 mg Oral TID  . antiseptic oral rinse  7 mL Mouth Rinse BID  . aspirin  81 mg Oral Daily  . bisacodyl  5 mg Oral Daily  . cephALEXin  500 mg Oral TID  . docusate sodium  200 mg  Oral QHS  . donepezil  5 mg Oral QHS  . DULoxetine  60 mg Oral Daily  . enoxaparin (LOVENOX) injection  40 mg Subcutaneous Q24H  . feeding supplement (ENSURE ENLIVE)  237 mL Oral BID BM  . [START ON 08/06/2014] fentaNYL  37.5 mcg Transdermal Q72H  . fluticasone  1 spray Each Nare BID  . folic acid  1 mg Oral Daily  . levothyroxine  88 mcg Oral QAC breakfast  . magnesium oxide  200 mg Oral Daily  . methocarbamol  500 mg Oral TID  . OLANZapine  5 mg Oral QHS  . senna  2 tablet Oral BID   Continuous Infusions:  PRN Meds:.bisacodyl, fentaNYL (SUBLIMAZE) injection, haloperidol, ondansetron **OR** ondansetron (ZOFRAN) IV, oxyCODONE, polyethylene glycol, traZODone   Antibiotics   Anti-infectives    Start     Dose/Rate Route Frequency Ordered Stop   07/30/14 1600  cephALEXin (KEFLEX) capsule 500 mg     500 mg Oral 3 times daily 07/30/14 1523          Subjective:   [name] repetitively  saying she wants to die  Objective:   Blood pressure 135/52, pulse 88, temperature 99.3 F (37.4 C), temperature source Oral, resp. rate 20, height 5\' 4"  (1.626 m), weight 48.081 kg (106 lb), SpO2 96 %.  Wt Readings from Last 3 Encounters:  07/31/14 48.081 kg (106 lb)  08/21/11 61.236 kg (135 lb)     Intake/Output Summary (Last 24 hours) at 08/05/14 1514 Last data filed at 08/05/14 0900  Gross per 24 hour  Intake    240 ml  Output    250 ml  Net    -10 ml    Exam  General: awake  CVS: S1 S2 clear, RRR  Respiratory: CTAB  Abdomen: Soft, NT, ND, NBS Multiple stages of healing bruises   Data Review   Micro Results Recent Results (from the past 240 hour(s))  MRSA PCR Screening     Status: None   Collection Time: 08/04/14 11:30 AM  Result Value Ref Range Status   MRSA by PCR NEGATIVE NEGATIVE Final    Comment:        The GeneXpert MRSA Assay (FDA approved for NASAL specimens only), is one component of a comprehensive MRSA colonization surveillance program. It is not intended to diagnose MRSA infection nor to guide or monitor treatment for MRSA infections.     Radiology Reports Dg Shoulder Right  07/27/2014   CLINICAL DATA:  07/29/2014 this morning, bruising and pain RIGHT shoulder  EXAM: RIGHT SHOULDER - 2+ VIEW  COMPARISON:  None  FINDINGS: Diffuse osseous demineralization.  AC joint alignment normal.  Slight irregularity of the distal clavicle question age-indeterminate distal RIGHT clavicular fracture.  No additional fracture, dislocation or bone destruction.  Visualized RIGHT ribs intact.  IMPRESSION: Osseous demineralization with questionable age-indeterminate distal RIGHT clavicular fracture.  No additional focal bony abnormality.   Electronically Signed   By: Larey Seat M.D.   On: 07/27/2014 13:29   Dg Knee 1-2 Views Right  07/30/2014   CLINICAL DATA:  Acute right knee pain after fall. Initial encounter.  EXAM: RIGHT KNEE - 1-2 VIEW  COMPARISON:  None available  currently.  FINDINGS: There is no evidence of fracture, dislocation, or joint effusion. There is no evidence of arthropathy or other focal bone abnormality. Soft tissues are unremarkable.  IMPRESSION: No significant abnormality seen in the right knee.   Electronically Signed   By: 08/01/2014, M.D.  On: 07/30/2014 13:29   Ct Head Wo Contrast  07/27/2014   CLINICAL DATA:  Fall. Laceration right-sided forehead. Bruising to the right eye. Epistaxis. Initial encounter. Personal history of dementia.  EXAM: CT HEAD WITHOUT CONTRAST  CT CERVICAL SPINE WITHOUT CONTRAST  TECHNIQUE: Multidetector CT imaging of the head and cervical spine was performed following the standard protocol without intravenous contrast. Multiplanar CT image reconstructions of the cervical spine were also generated.  COMPARISON:  MRI brain 04/19/2014  FINDINGS: CT HEAD FINDINGS  There is marked cerebral atrophy particularly involving the parietal and temporal lobes bilaterally. The ventricular system is dilated proportionate to the degree of atrophy. Moderate white matter changes are again seen bilaterally.  No acute infarct, hemorrhage, or mass lesion is present.  A posterolateral right maxillary sinus fracture is present. There is a comminuted fracture involving the anterior wall of the right maxillary sinus is well. An orbital floor fracture is suspected. The fracture extends superiorly to involve the lateral wall of the right orbit. The globe is intact. There is a segmental fracture of the right zygomatic arch.  Limited supraorbital soft tissue swelling is present without additional fractures.  CT CERVICAL SPINE FINDINGS  Cervical spine is imaged from the skullbase through T1-2. A hard collar is in place. Multilevel degenerative changes are present. Uncovertebral spurring is worse on the left at C3-4, C4-5, and C5-6. Left-sided osseous foraminal narrowing is present at these levels. No acute fracture or traumatic subluxation is present.   IMPRESSION: 1. Stable atrophy and white matter disease. The atrophy pattern is most prominent in the parietal and temporal lobes consistent with an Alzheimer's type pattern. 2. No acute intracranial abnormality. 3. Comminuted right maxillary sinus fracture with the blood layer within the sinus. 4. Suspect right orbital floor fracture. CT of the maxillofacial sinuses would be useful for further evaluation of this type fracture. 5. Comminuted right lateral orbital wall fracture and segmental right zygomatic arch fracture. 6. Degenerative changes of the cervical spine without evidence for acute trauma.   Electronically Signed   By: Marin Roberts M.D.   On: 07/27/2014 12:59   Ct Cervical Spine Wo Contrast  07/27/2014   CLINICAL DATA:  Fall. Laceration right-sided forehead. Bruising to the right eye. Epistaxis. Initial encounter. Personal history of dementia.  EXAM: CT HEAD WITHOUT CONTRAST  CT CERVICAL SPINE WITHOUT CONTRAST  TECHNIQUE: Multidetector CT imaging of the head and cervical spine was performed following the standard protocol without intravenous contrast. Multiplanar CT image reconstructions of the cervical spine were also generated.  COMPARISON:  MRI brain 04/19/2014  FINDINGS: CT HEAD FINDINGS  There is marked cerebral atrophy particularly involving the parietal and temporal lobes bilaterally. The ventricular system is dilated proportionate to the degree of atrophy. Moderate white matter changes are again seen bilaterally.  No acute infarct, hemorrhage, or mass lesion is present.  A posterolateral right maxillary sinus fracture is present. There is a comminuted fracture involving the anterior wall of the right maxillary sinus is well. An orbital floor fracture is suspected. The fracture extends superiorly to involve the lateral wall of the right orbit. The globe is intact. There is a segmental fracture of the right zygomatic arch.  Limited supraorbital soft tissue swelling is present without  additional fractures.  CT CERVICAL SPINE FINDINGS  Cervical spine is imaged from the skullbase through T1-2. A hard collar is in place. Multilevel degenerative changes are present. Uncovertebral spurring is worse on the left at C3-4, C4-5, and C5-6. Left-sided osseous foraminal narrowing  is present at these levels. No acute fracture or traumatic subluxation is present.  IMPRESSION: 1. Stable atrophy and white matter disease. The atrophy pattern is most prominent in the parietal and temporal lobes consistent with an Alzheimer's type pattern. 2. No acute intracranial abnormality. 3. Comminuted right maxillary sinus fracture with the blood layer within the sinus. 4. Suspect right orbital floor fracture. CT of the maxillofacial sinuses would be useful for further evaluation of this type fracture. 5. Comminuted right lateral orbital wall fracture and segmental right zygomatic arch fracture. 6. Degenerative changes of the cervical spine without evidence for acute trauma.   Electronically Signed   By: Marin Roberts M.D.   On: 07/27/2014 12:59   Ct Pelvis Wo Contrast  08/04/2014   CLINICAL DATA:  Recent fall on the right side on 07/27/2014 with prior pelvic fractures. Persistent hip pain.  EXAM: CT PELVIS WITHOUT CONTRAST  TECHNIQUE: Multidetector CT imaging of the pelvis was performed following the standard protocol without intravenous contrast.  COMPARISON:  10/22/2013 lumbar spine  FINDINGS: Advanced degenerative changes of the lower lumbar spine with vacuum disc phenomenon at L5-S1. Superior endplate compression fracture of L5 with approximately 50% height loss of the vertebral body. Minimal posterior retropulsion. Fracture lines are visualized along the anterior aspect, compatible with an acute lumbar compression fracture. This does not appear to extend into the posterior elements. Facet arthropathy evident. No subluxation or dislocation.  Bilateral mild SI joint arthropathy. Sacrum, pelvis, and hips appear  intact. Right lower extremity appears to have a contracture deformity. There are healed right superior and inferior rami fractures noted. No acute hip fracture.  Included views of the pelvis demonstrate aortic and iliac atherosclerosis. Retained stool throughout the colon compatible constipation. Large volume of stool in the rectum suggest fecal impaction. Small amount of pelvic free fluid. Small right ovarian cyst measures 3.5 cm, image 38.  IMPRESSION: Acute appearing L5 compression fracture predominately superior endplate with at least 50% height loss. Minimal retropulsion posteriorly. No associated anterolisthesis.  Sacrum, pelvis and hips appear intact.  Remote right superior and inferior rami fractures.  Other intrapelvic findings as above.   Electronically Signed   By: Judie Petit.  Shick M.D.   On: 08/04/2014 22:58   Dg Hand Complete Right  07/27/2014   CLINICAL DATA:  Right hand pain post fall this morning  EXAM: RIGHT HAND - COMPLETE 3+ VIEW  COMPARISON:  07/20/2013  FINDINGS: Three views of the right hand submitted. No displaced fracture or subluxation. There is cortical irregularity distal aspect of fifth metacarpal suspicious for subtle nondisplaced fracture. Clinical correlation is necessary.  IMPRESSION: No displaced fracture or subluxation. There is cortical irregularity distal aspect of fifth metacarpal suspicious for subtle nondisplaced fracture. Clinical correlation is necessary.   Electronically Signed   By: Natasha Mead M.D.   On: 07/27/2014 13:12   Dg Foot Complete Right  07/30/2014   CLINICAL DATA:  Multiple falls with right foot pain.  EXAM: RIGHT FOOT COMPLETE - 3+ VIEW  COMPARISON:  None.  FINDINGS: Mild diffuse decreased bone mineralization. Persistent flexion at the second proximal interphalangeal joint. No definite acute fracture or dislocation.  IMPRESSION: No acute findings.   Electronically Signed   By: Elberta Fortis M.D.   On: 07/30/2014 13:31   Dg Hip Unilat With Pelvis 2-3 Views  Right  07/30/2014   CLINICAL DATA:  Multiple falls. Severe pain. Patient unable to straighten RIGHT leg.  EXAM: RIGHT HIP (WITH PELVIS) 2-3 VIEWS  COMPARISON:  08/20/2011.  FINDINGS: There is no evidence of hip fracture or dislocation. There is no evidence of erosive arthropathy. Moderate callus surrounding RIGHT superior and inferior pubic ramus fractures.  IMPRESSION: Negative for acute abnormality.   Electronically Signed   By: Davonna Belling M.D.   On: 07/30/2014 13:35   Ct Maxillofacial Wo Cm  07/27/2014   CLINICAL DATA:  The patient fell and has right facial trauma with a laceration and facial fractures.  EXAM: CT MAXILLOFACIAL WITHOUT CONTRAST  TECHNIQUE: Multidetector CT imaging of the maxillofacial structures was performed. Multiplanar CT image reconstructions were also generated. A small metallic BB was placed on the right temple in order to reliably differentiate right from left.  COMPARISON:  None.  FINDINGS: There is a tripod fracture of the right facial bones with a slightly displaced fracture of the posterior aspect of the right zygomatic arch, anterior and lateral wall maxillary sinus fractures, nondisplaced hairline fractures through the medial wall of the right maxillary sinus, and minimally depressed fracture of the floor of the right orbit. The right inferior orbital rim appears to be intact. There is no entrapment of extraocular muscles of the right orbit. There is an blood fluid level in the right maxillary sinus.  Fracture of the lateral wall of the right orbit.  The other facial bones are normal.  Note is made of severe temporal lobe atrophy.  IMPRESSION: Fractures of the anterior, medial, and posterior lateral walls of the right maxillary sinus as well as of the right zygomatic arch and of the lateral wall of the right orbit. No muscle entrapment.   Electronically Signed   By: Francene Boyers M.D.   On: 07/27/2014 14:42    CBC  Recent Labs Lab 07/30/14 1149  WBC 9.3  HGB 12.3  HCT  36.1  PLT 203  MCV 98.9  MCH 33.7  MCHC 34.1  RDW 12.3  LYMPHSABS 1.1  MONOABS 0.9  EOSABS 0.0  BASOSABS 0.0    Chemistries   Recent Labs Lab 07/30/14 1149  NA 139  K 3.6  CL 100  CO2 29  GLUCOSE 111*  BUN 6  CREATININE 0.52  CALCIUM 9.2   ------------------------------------------------------------------------------------------------------------------ estimated creatinine clearance is 49 mL/min (by C-G formula based on Cr of 0.52). ------------------------------------------------------------------------------------------------------------------ No results for input(s): HGBA1C in the last 72 hours. ------------------------------------------------------------------------------------------------------------------ No results for input(s): CHOL, HDL, LDLCALC, TRIG, CHOLHDL, LDLDIRECT in the last 72 hours. ------------------------------------------------------------------------------------------------------------------ No results for input(s): TSH, T4TOTAL, T3FREE, THYROIDAB in the last 72 hours.  Invalid input(s): FREET3 ------------------------------------------------------------------------------------------------------------------ No results for input(s): VITAMINB12, FOLATE, FERRITIN, TIBC, IRON, RETICCTPCT in the last 72 hours.  Coagulation profile No results for input(s): INR, PROTIME in the last 168 hours.  No results for input(s): DDIMER in the last 72 hours.  Cardiac Enzymes No results for input(s): CKMB, TROPONINI, MYOGLOBIN in the last 168 hours.  Invalid input(s): CK ------------------------------------------------------------------------------------------------------------------ Invalid input(s): POCBNP  No results for input(s): GLUCAP in the last 72 hours.   Elisavet Buehrer DO. [facility] 08/05/2014, 3:14 PM  Pager: [phone]     After 7pm go to [email or web address] - password [email or web address]  Call night coverage person covering after 7pm

## 2014-08-05 NOTE — Clinical Social Work Note (Signed)
Husband does not want the patient to go to SNF for rehab. He wishes for the patient to return to [facility] with hospice services. He understands that [facility] will have to approve this before the patient can be discharged back to the facility. CSW explained that the patient may require more assistance than the ALF can provide. The husband shares that he would not be able to pay privately for SNF with hospice and seems to be unable to pay for private care givers due to limited resources.   Roddie Mc MSW, [place], [place], [phone]

## 2014-08-05 NOTE — Progress Notes (Signed)
Pt was manually disimpacted. Produced a very large BM.

## 2014-08-05 NOTE — Progress Notes (Signed)
Patient KG:[id number] M Guin      DOB: 1946/02/17      VOZ:[id number]   Palliative Medicine Team at [facility] Progress Note    Subjective: Tearful, in pain. States she wants to die.  Calms down some with some talking. Not sleeping well. Husband reports she has been eating well. 1 BM recorded in epic after enema.  Most of pain today is reportedly in knee/shoulders. Reports some back pain but this seems to be mild in comparison to others.    Filed Vitals:   08/05/14 0543  BP: 145/62  Pulse: 79  Temp: 98.4 F (36.9 C)  Resp: 16   Physical exam: GEN: alert, confused CV: regular rate ABD: soft, ND EXT: warm  CBC    Component Value Date/Time   WBC 9.3 07/30/2014 1149   RBC 3.65* 07/30/2014 1149   HGB 12.3 07/30/2014 1149   HCT 36.1 07/30/2014 1149   PLT 203 07/30/2014 1149   MCV 98.9 07/30/2014 1149   MCH 33.7 07/30/2014 1149   MCHC 34.1 07/30/2014 1149   RDW 12.3 07/30/2014 1149   LYMPHSABS 1.1 07/30/2014 1149   MONOABS 0.9 07/30/2014 1149   EOSABS 0.0 07/30/2014 1149   BASOSABS 0.0 07/30/2014 1149    CMP     Component Value Date/Time   NA 139 07/30/2014 1149   K 3.6 07/30/2014 1149   CL 100 07/30/2014 1149   CO2 29 07/30/2014 1149   GLUCOSE 111* 07/30/2014 1149   BUN 6 07/30/2014 1149   CREATININE 0.52 07/30/2014 1149   CALCIUM 9.2 07/30/2014 1149   PROT 6.2 08/20/2011 1255   ALBUMIN 3.6 08/20/2011 1255   AST 25 08/20/2011 1255   ALT 20 08/20/2011 1255   ALKPHOS 43 08/20/2011 1255   BILITOT 0.3 08/20/2011 1255   GFRNONAA >90 07/30/2014 1149   GFRAA >90 07/30/2014 1149   08/04/14 CT Pelvis IMPRESSION: Acute appearing L5 compression fracture predominately superior endplate with at least 50% height loss. Minimal retropulsion posteriorly. No associated anterolisthesis.  Sacrum, pelvis and hips appear intact.  Remote right superior and inferior rami fractures.  Other intrapelvic findings as above.   Assessment and plan: 69 yo female with  hypothyroidism, RA who presented with fall and multiple fractures as noted.    1. Code Status- DNR  2. GOC- See discussion by Dr Phillips Odor. Husband states that his goal is to keep her comfortable as she continues to decline through illness. I discussed situation with husband and CSW today.  Disposition will remain challenging issues.  Husband hopes for her to return to ALF, but in current state would require 24h care.  He does not think he could afford caregivers at ALF. Does not feel he could care for her at home.  He does not feel she would do well in rehab as discussed by Dr Phillips Odor. I do not think she would qualify for residential hospice placement.  CSW to look into options for dispo.   Husband really struggling with declined condition as she has been doing poorly here and while had underlying dementia, this has been a drastic change. He has children who do not live locally and are unable to give him much support.   3. Symptom Management  1. Acute pain related to fall/fractures (R knee pain predominant)- On Fentanyl patch, flector patch, robaxin, PRN oxycodone/fentanyl. Used 50mg  of oxycodone and of PRN IV fentanyl in past 24h.  Husband reports that pain seems to be worsening despite increased pain medicaiton here.  This  is concerning for acute delirium perhaps driving at least some of these pain behaviors. Will D/C flector patch as husband/pt report no benefit.  Increase Fentanyl patch to 37.58mcg/hr and continue PRN's.    2. Agitated Delirium- not sleeping at night so will add zyprexa which should also help with that. PRN haldol during day.    3. Constipation-  As noted on CT.  TO receive another enema today. Will monitor. Will increase senna to add stim laxative with opioids.  This could also be contributing to delirium.   4. Back Pain- L5 compression fracture noted on CT. Discussed with Dr Benjamine Mola who will ask IR to eval for possible vertebroplasty as suggested by Dr Phillips Odor.  Will await their  eval. Not having a lot of complaints of back pain today but she has difficulty telling a pain story with suspected delirium on top of her dementia.    Total Time: 40 minutes >50% of time spent in counseling and coordination of care regarding above issues.  Discussed case with Dr Phillips Odor today.     Orvis Brill D.O. Palliative Medicine Team at [facility]  Pager: [phone] Team Phone: [phone]

## 2014-08-05 NOTE — Progress Notes (Signed)
Utilization review complete 

## 2014-08-05 NOTE — Progress Notes (Signed)
Palliative Care  Results Follow-up  69 yo woman with complications following traumatic fall at ALF she has had severe pain (Complains of right knee pain but when asked to point she rubs the entire length of the upper right leg) that has not improved since her hospitalization and has actually worsened overtime which indicate clinical instability. On exam this evening I had increasing concern for either pelvic fracture or occult hip fracture -CT actually showed an acute lumbar compression fracture (acute appearing L5 compression fracture predominately superior endplate with at least 50% height loss) and a very large stool burden in the colon with evidence of impaction. All of the above contributing to her worsening pain.    Recommend evaluation for Vertebroplasty given pain refractory to opiates-she may or may not be an appropriate candidate.  Start Nasal Calcitonin 200UM daily alternating nares (only for two weeks then discontinue)  Could also explore local/regional nerve blocks if pain remains refractory   Needs Manual disimpaction and an enema until sufficient BM is passed.  Disposition noted- patient with continued escalation of pain and new findings on CT, needing IV pain and muscle spasm medication- Husband considering Hospice at her ALF vs acute rehab at discharge given her decline, pain management needs and frailty. I will update her husband on CT findings tomorrow.  Anderson Malta, DO Palliative Medicine [phone]

## 2014-08-05 NOTE — Clinical Social Work Note (Addendum)
CSW spoke with Dr. Phillips Odor this afternoon regarding patient. At this time, rehab may still be an option for the patient depending on how she does with vertebroplasty. If pain is minimized and patient can engage with therapy, SNF may still be a viable option for the patient. CSW has updated [facility]. Facility is still able to offer a bed pending the [employer]. Facility states they will NOT be saving the patient's original room. CSW made the husband aware of this today. CSW also spoke at length with [facility] regarding patient. Billy Coast at the facility is unsure if they can manage the patient's needs at this time. Billy Coast states that she will plan to evaluate the patient tomorrow. Will wait to see if the patient is a candidate for vertebroplasty and follow up.    Roddie Mc MSW, [place], [place], [phone]

## 2014-08-06 ENCOUNTER — Inpatient Hospital Stay (HOSPITAL_COMMUNITY): Payer: Medicare PPO

## 2014-08-06 LAB — CREATININE, SERUM
Creatinine, Ser: 0.46 mg/dL — ABNORMAL LOW (ref 0.50–1.10)
GFR calc Af Amer: >90 mL/min (ref >90)

## 2014-08-06 MED ORDER — FENTANYL 25 MCG/HR TD PT72
25.0000 ug | MEDICATED_PATCH | TRANSDERMAL | Status: DC
  Administered 2014-08-06: 25 ug via TRANSDERMAL
  Filled 2014-08-06: qty 1

## 2014-08-06 NOTE — Progress Notes (Signed)
Nutrition Follow-up  DOCUMENTATION CODES:  Severe malnutrition in context of chronic illness  INTERVENTION:  Ensure Enlive (each supplement provides 350kcal and 20 grams of protein)  NUTRITION DIAGNOSIS:  Malnutrition related to chronic illness as evidenced by severe depletion of muscle mass, severe depletion of body fat; ongoing  GOAL:  Patient will meet greater than or equal to 90% of their needs; unmet   MONITOR:  PO intake, Supplement acceptance, Weight trends, Labs, Skin, I & O's  REASON FOR ASSESSMENT:  Malnutrition Screening Tool    ASSESSMENT:  Pt with prior h/o hypothyroidism, RA, had a mechanical fall to the right side on 4.18 and ended up having right superior and inferior pubic ramus fractures, and fractures of the anterior, medial and posterior lateral walls of the right maxillary sinus and of the right zygomatic arch and the lateral wall of the right orbit.  Chart reviewed. Pt continues to decline and decision has been to transition pt to mainly comfort-focused care. Per palliative care, pt does not qualify for residential hospice. Discharge disposition is to ALF with hospice services or SNF. Therapy to re-evaluate today and ALF will assess pt to determine if pt will be able to discharge back to facility due to increased care needs.   Meal completion is 75-90% per doc flowsheets. Pt also has Ensure Enlive BID ordered. Pt is being offered, but noted pt has refused supplement the past two times it has been offered. RD will continue with supplement.   Labs reviewed. Creat: 0.46.   Height:  Ht Readings from Last 1 Encounters:  07/31/14 5\' 4"  (1.626 m)    Weight:  Wt Readings from Last 1 Encounters:  07/31/14 106 lb (48.081 kg)    Ideal Body Weight:  120#  Wt Readings from Last 10 Encounters:  07/31/14 106 lb (48.081 kg)  08/21/11 135 lb (61.236 kg)    BMI:  Body mass index is 18.19 kg/(m^2). Underweight  Estimated Nutritional Needs:  Kcal:   1400-1600  Protein:  60-70 grams  Fluid:  1.4-1.6 L  Skin:  Wound (see comment) (rt facial laceration, stage II PU to sacrum)  Diet Order:  Diet regular Room service appropriate?: Yes; Fluid consistency:: Thin  EDUCATION NEEDS:  Education needs no appropriate at this time   Intake/Output Summary (Last 24 hours) at 08/06/14 1521 Last data filed at 08/06/14 1359  Gross per 24 hour  Intake    480 ml  Output    500 ml  Net    -20 ml    Last BM:  08/05/14  [name] A. 08/07/14, RD, LDN, CDE Pager: [phone] After hours Pager: [phone]

## 2014-08-06 NOTE — Consult Note (Signed)
Chief Complaint: Chief Complaint  Patient presents with  . Fall  B knee pain Incidental Lumbar 5 fx  Referring Physician(s): Dr Phillips Odor Dr Benjamine Mola  History of Present Illness: [name] is a 69 y.o. female   Pt fell at [employer] 07/27/2014 Multiple injuries--Rt pelvic fxs; Rt maxillary sinus and zygomatic fx Pt admitted to [facility] with multiple complaints and intractable pain Work up included CT which did reveal Lumbar 5 compression fx Examination of pt does not illicit back pain Pt only complains of Rt knee pain (neg for fx per imaging)  Request made for L5 vertebroplasty/kyphoplasty Dr Bonnielee Haff reviewed imaging and approved procedure if pain coincided with fx area   Past Medical History  Diagnosis Date  . Osteoporosis   . Hypothyroid   . Depression   . Rheumatoid arthritis(714.0)   . DDD (degenerative disc disease)     bone spurs in spine also    Past Surgical History  Procedure Laterality Date  . Tonsillectomy    . Wisdom tooth extraction    . Abdominal hysterectomy      also removed left ovary  . Abdominal hysterectomy    . Orif humerus fracture  08/22/2011    Procedure: OPEN REDUCTION INTERNAL FIXATION (ORIF) HUMERAL SHAFT FRACTURE;  Surgeon: Kathryne Hitch, MD;  Location: [facility] ORS;  Service: Orthopedics;  Laterality: Left;  Open Reduction Internal Fixation Left Humeral Shaft Fracture    Allergies: Sulfa antibiotics  Medications: Prior to Admission medications   Medication Sig Start Date End Date Taking? Authorizing Provider  acetaminophen (TYLENOL) 500 MG tablet Take 1,000 mg by mouth at bedtime.    Yes Historical Provider, MD  aspirin 81 MG chewable tablet Chew 81 mg by mouth daily.   Yes Historical Provider, MD  Calcium Carb-Cholecalciferol 600-200 MG-UNIT TABS Take 1 tablet by mouth daily.   Yes Historical Provider, MD  Calcium Carbonate 500 MG CHEW Chew 1 tablet by mouth daily as needed (heartburn).   Yes Historical Provider, MD  cephALEXin  (KEFLEX) 500 MG capsule Take 1 capsule (500 mg total) by mouth 3 (three) times daily. 07/27/14  Yes Jeffrey Hedges, PA-C  cholecalciferol (VITAMIN D) 1000 UNITS tablet Take 2,000 Units by mouth daily.   Yes Historical Provider, MD  Cyanocobalamin (VITAMIN B 12 PO) Take 1 tablet by mouth daily.   Yes Historical Provider, MD  diclofenac (FLECTOR) 1.3 % PTCH Place 1 patch onto the skin 2 (two) times daily.   Yes Historical Provider, MD  docusate sodium (COLACE) 100 MG capsule Take 200 mg by mouth at bedtime.   Yes Historical Provider, MD  donepezil (ARICEPT) 5 MG tablet Take 5 mg by mouth at bedtime.   Yes Historical Provider, MD  DULoxetine (CYMBALTA) 60 MG capsule Take 60 mg by mouth daily.   Yes Historical Provider, MD  etanercept (ENBREL) 50 MG/ML injection Inject 50 mg into the skin once a week. On tuesdays.   Yes Historical Provider, MD  fluticasone (FLONASE) 50 MCG/ACT nasal spray Place 1 spray into both nostrils 2 (two) times daily.   Yes Historical Provider, MD  folic acid (FOLVITE) 1 MG tablet Take 1 mg by mouth daily.   Yes Historical Provider, MD  HYDROcodone-acetaminophen (NORCO/VICODIN) 5-325 MG per tablet Take 1 tablet by mouth every 6 (six) hours as needed. Patient taking differently: Take 1 tablet by mouth every 6 (six) hours as needed for moderate pain.  07/27/14  Yes Harle Battiest, NP  levothyroxine (SYNTHROID, LEVOTHROID) 88 MCG tablet Take  88 mcg by mouth daily before breakfast.   Yes Historical Provider, MD  Magnesium Oxide 250 MG TABS Take 250 mg by mouth daily.   Yes Historical Provider, MD  methotrexate (RHEUMATREX) 2.5 MG tablet Take 7.5 mg by mouth once a week. Take on Mondays   Yes Historical Provider, MD  prenatal vitamin w/FE, FA (PRENATAL 1 + 1) 27-1 MG TABS Take 1 tablet by mouth daily.   Yes Historical Provider, MD  magnesium oxide (MAG-OX) 400 MG tablet Take 400 mg by mouth daily.    Historical Provider, MD     Family History  Problem Relation Age of Onset  .  Heart attack Mother   . Cancer Father   . Heart attack Brother   . Cancer Other   . Heart attack Other     History   Social History  . Marital Status: Married    Spouse Name: N/A  . Number of Children: N/A  . Years of Education: N/A   Social History Main Topics  . Smoking status: Former Smoker -- 2.00 packs/day    Types: Cigarettes    Quit date: 05/15/1991  . Smokeless tobacco: Never Used  . Alcohol Use: Yes     Comment: sober for 30 years  history of alcoholism  . Drug Use: No  . Sexual Activity: Not Currently   Other Topics Concern  . None   Social History Narrative     Review of Systems: A 12 point ROS discussed and pertinent positives are indicated in the HPI above.  All other systems are negative.  Review of Systems  Constitutional: Positive for activity change. Negative for fever and unexpected weight change.  Respiratory: Negative for shortness of breath.   Cardiovascular: Negative for chest pain.  Gastrointestinal: Negative for abdominal pain.  Genitourinary: Negative for difficulty urinating.  Musculoskeletal: Positive for gait problem. Negative for back pain.  Psychiatric/Behavioral: Positive for behavioral problems, confusion, decreased concentration and agitation.    Vital Signs: BP 123/53 mmHg  Pulse 73  Temp(Src) 98 F (36.7 C) (Oral)  Resp 18  Ht  (1.626 m)  Wt 48.081 kg (106 lb)  BMI 18.19 kg/m2  SpO2 94%  Physical Exam  Cardiovascular: Normal rate and regular rhythm.   Pulmonary/Chest: Effort normal and breath sounds normal.  Abdominal: Soft.  Musculoskeletal: Normal range of motion. She exhibits tenderness.  Pt moves in bed easily; able to reach for items and able to roll side to side without complaint if back pain Painful Rt knee Painful B shoulder No visual injury sign located on back Palpated; gentle knocking on low back- no pain She does have a reddened area at the L buttock that is tender to touch--?bed sore?     Neurological:  Dementia; but does seem to communicate where she hurts Complains only of Rt knee pain  Skin: Skin is warm and dry.  Psychiatric:  Husband at bedside   Nursing note and vitals reviewed.   Mallampati Score:     Imaging: Dg Shoulder Right  07/27/2014   CLINICAL DATA:  Larey Seat this morning, bruising and pain RIGHT shoulder  EXAM: RIGHT SHOULDER - 2+ VIEW  COMPARISON:  None  FINDINGS: Diffuse osseous demineralization.  AC joint alignment normal.  Slight irregularity of the distal clavicle question age-indeterminate distal RIGHT clavicular fracture.  No additional fracture, dislocation or bone destruction.  Visualized RIGHT ribs intact.  IMPRESSION: Osseous demineralization with questionable age-indeterminate distal RIGHT clavicular fracture.  No additional focal bony abnormality.   Electronically  Signed   By: Ulyses Southward M.D.   On: 07/27/2014 13:29   Dg Knee 1-2 Views Right  07/30/2014   CLINICAL DATA:  Acute right knee pain after fall. Initial encounter.  EXAM: RIGHT KNEE - 1-2 VIEW  COMPARISON:  None available currently.  FINDINGS: There is no evidence of fracture, dislocation, or joint effusion. There is no evidence of arthropathy or other focal bone abnormality. Soft tissues are unremarkable.  IMPRESSION: No significant abnormality seen in the right knee.   Electronically Signed   By: Lupita Raider, M.D.   On: 07/30/2014 13:29   Ct Head Wo Contrast  07/27/2014   CLINICAL DATA:  Fall. Laceration right-sided forehead. Bruising to the right eye. Epistaxis. Initial encounter. Personal history of dementia.  EXAM: CT HEAD WITHOUT CONTRAST  CT CERVICAL SPINE WITHOUT CONTRAST  TECHNIQUE: Multidetector CT imaging of the head and cervical spine was performed following the standard protocol without intravenous contrast. Multiplanar CT image reconstructions of the cervical spine were also generated.  COMPARISON:  MRI brain 04/19/2014  FINDINGS: CT HEAD FINDINGS  There is marked cerebral  atrophy particularly involving the parietal and temporal lobes bilaterally. The ventricular system is dilated proportionate to the degree of atrophy. Moderate white matter changes are again seen bilaterally.  No acute infarct, hemorrhage, or mass lesion is present.  A posterolateral right maxillary sinus fracture is present. There is a comminuted fracture involving the anterior wall of the right maxillary sinus is well. An orbital floor fracture is suspected. The fracture extends superiorly to involve the lateral wall of the right orbit. The globe is intact. There is a segmental fracture of the right zygomatic arch.  Limited supraorbital soft tissue swelling is present without additional fractures.  CT CERVICAL SPINE FINDINGS  Cervical spine is imaged from the skullbase through T1-2. A hard collar is in place. Multilevel degenerative changes are present. Uncovertebral spurring is worse on the left at C3-4, C4-5, and C5-6. Left-sided osseous foraminal narrowing is present at these levels. No acute fracture or traumatic subluxation is present.  IMPRESSION: 1. Stable atrophy and white matter disease. The atrophy pattern is most prominent in the parietal and temporal lobes consistent with an Alzheimer's type pattern. 2. No acute intracranial abnormality. 3. Comminuted right maxillary sinus fracture with the blood layer within the sinus. 4. Suspect right orbital floor fracture. CT of the maxillofacial sinuses would be useful for further evaluation of this type fracture. 5. Comminuted right lateral orbital wall fracture and segmental right zygomatic arch fracture. 6. Degenerative changes of the cervical spine without evidence for acute trauma.   Electronically Signed   By: Marin Roberts M.D.   On: 07/27/2014 12:59   Ct Cervical Spine Wo Contrast  07/27/2014   CLINICAL DATA:  Fall. Laceration right-sided forehead. Bruising to the right eye. Epistaxis. Initial encounter. Personal history of dementia.  EXAM: CT  HEAD WITHOUT CONTRAST  CT CERVICAL SPINE WITHOUT CONTRAST  TECHNIQUE: Multidetector CT imaging of the head and cervical spine was performed following the standard protocol without intravenous contrast. Multiplanar CT image reconstructions of the cervical spine were also generated.  COMPARISON:  MRI brain 04/19/2014  FINDINGS: CT HEAD FINDINGS  There is marked cerebral atrophy particularly involving the parietal and temporal lobes bilaterally. The ventricular system is dilated proportionate to the degree of atrophy. Moderate white matter changes are again seen bilaterally.  No acute infarct, hemorrhage, or mass lesion is present.  A posterolateral right maxillary sinus fracture is present. There is a  comminuted fracture involving the anterior wall of the right maxillary sinus is well. An orbital floor fracture is suspected. The fracture extends superiorly to involve the lateral wall of the right orbit. The globe is intact. There is a segmental fracture of the right zygomatic arch.  Limited supraorbital soft tissue swelling is present without additional fractures.  CT CERVICAL SPINE FINDINGS  Cervical spine is imaged from the skullbase through T1-2. A hard collar is in place. Multilevel degenerative changes are present. Uncovertebral spurring is worse on the left at C3-4, C4-5, and C5-6. Left-sided osseous foraminal narrowing is present at these levels. No acute fracture or traumatic subluxation is present.  IMPRESSION: 1. Stable atrophy and white matter disease. The atrophy pattern is most prominent in the parietal and temporal lobes consistent with an Alzheimer's type pattern. 2. No acute intracranial abnormality. 3. Comminuted right maxillary sinus fracture with the blood layer within the sinus. 4. Suspect right orbital floor fracture. CT of the maxillofacial sinuses would be useful for further evaluation of this type fracture. 5. Comminuted right lateral orbital wall fracture and segmental right zygomatic arch  fracture. 6. Degenerative changes of the cervical spine without evidence for acute trauma.   Electronically Signed   By: Marin Roberts M.D.   On: 07/27/2014 12:59   Ct Pelvis Wo Contrast  08/04/2014   CLINICAL DATA:  Recent fall on the right side on 07/27/2014 with prior pelvic fractures. Persistent hip pain.  EXAM: CT PELVIS WITHOUT CONTRAST  TECHNIQUE: Multidetector CT imaging of the pelvis was performed following the standard protocol without intravenous contrast.  COMPARISON:  10/22/2013 lumbar spine  FINDINGS: Advanced degenerative changes of the lower lumbar spine with vacuum disc phenomenon at L5-S1. Superior endplate compression fracture of L5 with approximately 50% height loss of the vertebral body. Minimal posterior retropulsion. Fracture lines are visualized along the anterior aspect, compatible with an acute lumbar compression fracture. This does not appear to extend into the posterior elements. Facet arthropathy evident. No subluxation or dislocation.  Bilateral mild SI joint arthropathy. Sacrum, pelvis, and hips appear intact. Right lower extremity appears to have a contracture deformity. There are healed right superior and inferior rami fractures noted. No acute hip fracture.  Included views of the pelvis demonstrate aortic and iliac atherosclerosis. Retained stool throughout the colon compatible constipation. Large volume of stool in the rectum suggest fecal impaction. Small amount of pelvic free fluid. Small right ovarian cyst measures 3.5 cm, image 38.  IMPRESSION: Acute appearing L5 compression fracture predominately superior endplate with at least 50% height loss. Minimal retropulsion posteriorly. No associated anterolisthesis.  Sacrum, pelvis and hips appear intact.  Remote right superior and inferior rami fractures.  Other intrapelvic findings as above.   Electronically Signed   By: Judie Petit.  Shick M.D.   On: 08/04/2014 22:58   Dg Hand Complete Right  07/27/2014   CLINICAL DATA:  Right  hand pain post fall this morning  EXAM: RIGHT HAND - COMPLETE 3+ VIEW  COMPARISON:  07/20/2013  FINDINGS: Three views of the right hand submitted. No displaced fracture or subluxation. There is cortical irregularity distal aspect of fifth metacarpal suspicious for subtle nondisplaced fracture. Clinical correlation is necessary.  IMPRESSION: No displaced fracture or subluxation. There is cortical irregularity distal aspect of fifth metacarpal suspicious for subtle nondisplaced fracture. Clinical correlation is necessary.   Electronically Signed   By: Natasha Mead M.D.   On: 07/27/2014 13:12   Dg Foot Complete Right  07/30/2014   CLINICAL DATA:  Multiple  falls with right foot pain.  EXAM: RIGHT FOOT COMPLETE - 3+ VIEW  COMPARISON:  None.  FINDINGS: Mild diffuse decreased bone mineralization. Persistent flexion at the second proximal interphalangeal joint. No definite acute fracture or dislocation.  IMPRESSION: No acute findings.   Electronically Signed   By: Elberta Fortis M.D.   On: 07/30/2014 13:31   Dg Hip Unilat With Pelvis 2-3 Views Right  07/30/2014   CLINICAL DATA:  Multiple falls. Severe pain. Patient unable to straighten RIGHT leg.  EXAM: RIGHT HIP (WITH PELVIS) 2-3 VIEWS  COMPARISON:  08/20/2011.  FINDINGS: There is no evidence of hip fracture or dislocation. There is no evidence of erosive arthropathy. Moderate callus surrounding RIGHT superior and inferior pubic ramus fractures.  IMPRESSION: Negative for acute abnormality.   Electronically Signed   By: Davonna Belling M.D.   On: 07/30/2014 13:35   Ct Maxillofacial Wo Cm  07/27/2014   CLINICAL DATA:  The patient fell and has right facial trauma with a laceration and facial fractures.  EXAM: CT MAXILLOFACIAL WITHOUT CONTRAST  TECHNIQUE: Multidetector CT imaging of the maxillofacial structures was performed. Multiplanar CT image reconstructions were also generated. A small metallic BB was placed on the right temple in order to reliably differentiate right  from left.  COMPARISON:  None.  FINDINGS: There is a tripod fracture of the right facial bones with a slightly displaced fracture of the posterior aspect of the right zygomatic arch, anterior and lateral wall maxillary sinus fractures, nondisplaced hairline fractures through the medial wall of the right maxillary sinus, and minimally depressed fracture of the floor of the right orbit. The right inferior orbital rim appears to be intact. There is no entrapment of extraocular muscles of the right orbit. There is an blood fluid level in the right maxillary sinus.  Fracture of the lateral wall of the right orbit.  The other facial bones are normal.  Note is made of severe temporal lobe atrophy.  IMPRESSION: Fractures of the anterior, medial, and posterior lateral walls of the right maxillary sinus as well as of the right zygomatic arch and of the lateral wall of the right orbit. No muscle entrapment.   Electronically Signed   By: Francene Boyers M.D.   On: 07/27/2014 14:42    Labs:  CBC:  Recent Labs  07/27/14 1128 07/27/14 1140 07/30/14 1149  WBC 8.6  --  9.3  HGB 13.5 14.6 12.3  HCT 40.2 43.0 36.1  PLT 170  --  203    COAGS: No results for input(s): INR, APTT in the last 8760 hours.  BMP:  Recent Labs  07/27/14 1140 07/30/14 1149 08/06/14 0529  NA 139 139  --   K 3.7 3.6  --   CL 97 100  --   CO2  --  29  --   GLUCOSE 114* 111*  --   BUN 6 6  --   CALCIUM  --  9.2  --   CREATININE 0.60 0.52 0.46*  GFRNONAA  --  >90 >90  GFRAA  --  >90 >90    LIVER FUNCTION TESTS: No results for input(s): BILITOT, AST, ALT, ALKPHOS, PROT, ALBUMIN in the last 8760 hours.  TUMOR MARKERS: No results for input(s): AFPTM, CEA, CA199, CHROMGRNA in the last 8760 hours.  Assessment and Plan:  Mechanical fall 07/27/14 Pelvic and facial fxs L5 fx on CT Patients complains continually of pain in Rt knee mostly Does not complain of back pain nor does exam illicit back pain  Knee xray is neg for  fx Discussed with Dr Bonnielee Haff Knee pain is not likely related to L5 fx VP/KP not recommended in this case. Dr Greig Right aware   Thank you for this interesting consult.  I greatly enjoyed meeting [name] and look forward to participating in their care.  Signed: Manilla Strieter A 08/06/2014, 12:19 PM   I spent a total of 40 Minutes  in face to face in clinical consultation, greater than 50% of which was counseling/coordinating care for L5 fx

## 2014-08-06 NOTE — Progress Notes (Addendum)
Patient ID: [name], female   DOB: 1945/07/16, 69 y.o.   MRN: [id number]   Aware of L5 KP/VP request Imaging has been reviewed with Dr Bonnielee Haff Acute L5 fx noted Scheduler is pre Immunologist to see pt yesterday Although pt is in demented/confused state She completely denies back pain She is able to move about bed pretty easily without discomfort She does complain of knee pain and seems certain of this source  Husband not in room at time Husband returned and RN called to say he was there  Unfortunately I was unable to get back to pts room in time to see husband Will still need to discuss with him VP/KP Not sure pt is candidate for this procedure if back is not her focus of pain

## 2014-08-06 NOTE — Progress Notes (Addendum)
[facility]                                                                              Patient Demographics  [name], is a 69 y.o. female, DOB - 02-Jul-1945, ZHY:[id number]  Admit date - 07/30/2014   Admitting Physician Kathlen Mody, MD  Outpatient Primary MD for the patient is Juline Patch, MD  LOS - 7   Chief Complaint  Patient presents with  . Fall       Brief HPI   [name] is a 69 y.o. female with prior h/o hypothyroidism, RA, had a mechanical fall to the right side on 4.18 and ended up having right superior and inferior pubic ramus fractures, and fractures of the anterior, medial and posterior lateral walls of the right maxillary sinus and of the right zygomatic arch and the lateral wall of the right orbit. She was started on oral hydrocodone and was seen by ENT on 4/18. She was also started on keflex for maxillary sinus wall fractures. But she reported persistent and had to be transferred to ED for pain control. In ED she was given one percocet and referred tom edical service for admission for pain control. On arrival to ED, she was in moderate distress from knee pain, tachycardic and hypertensive. Labs were unremarkable. She was admitted to m ed surg for pain control.    Assessment & Plan      Right knee pain; unclear etiology, patient had a mechanical fall on the right side on 4/18 had had several fractures including right superior and inferior pubic rami fracture and right maxillary sinus fracture, right orbital fracture - X-ray of the right knee did not show any fracture or dislocation - Orthopedics consulted, patient was admitted by Dr. Eulah Pont who recommended nonoperative pain control, no signs of any septic arthritis or fracture. - Pain control: appreciate palliative medicine -  PT evaluation recommended skilled nursing facility   Compression fx, L5- acute -not sure this is causing her pain -asked IR to see  delirium -appreciate  palliative care -more oriented today    Hypothyroid Continue Synthroid    Fracture of multiple pubic rami - Nonoperative, PT/OT   Depression with dementia - Continue Cymbalta, Aricept  Recent fracture of inferior, medial, posterior lateral walls of right maxilla sinus, right zygomatic arch, lateral wall of the right orbit - Continue Keflex as recommended by ENT    right distal clavicular fracture, subtle nondisplaced fracture of fifth metacarpal distally - Continue shoulder immobilizer for comfort, wrist splint  Right patellar fx- ortho to place brace  Code Status: DNR  Family Communication: husband  Disposition Plan:   Time Spent in minutes  25 minutes  Procedures  None   Consults   Orthopedics  DVT Prophylaxis   Lovenox   Medications  Scheduled Meds: . acetaminophen  650 mg Oral TID  . antiseptic oral rinse  7 mL Mouth Rinse BID  . aspirin  81 mg Oral Daily  . bisacodyl  5 mg Oral Daily  . cephALEXin  500 mg Oral TID  . docusate sodium  200 mg Oral QHS  . donepezil  5  mg Oral QHS  . DULoxetine  60 mg Oral Daily  . enoxaparin (LOVENOX) injection  40 mg Subcutaneous Q24H  . feeding supplement (ENSURE ENLIVE)  237 mL Oral BID BM  . fentaNYL  37.5 mcg Transdermal Q72H  . fluticasone  1 spray Each Nare BID  . folic acid  1 mg Oral Daily  . levothyroxine  88 mcg Oral QAC breakfast  . magnesium oxide  200 mg Oral Daily  . methocarbamol  500 mg Oral TID  . OLANZapine  5 mg Oral QHS  . senna  2 tablet Oral BID   Continuous Infusions:  PRN Meds:.bisacodyl, haloperidol, ondansetron **OR** ondansetron (ZOFRAN) IV, oxyCODONE, polyethylene glycol, traZODone   Antibiotics   Anti-infectives    Start     Dose/Rate Route Frequency Ordered Stop   07/30/14 1600  cephALEXin (KEFLEX) capsule 500 mg     500 mg Oral 3 times daily 07/30/14 1523          Subjective:   Ngoc Raimondo c/o pain in neck Shoulder feels better No back pain today   Objective:   Blood  pressure 123/53, pulse 73, temperature 98 F (36.7 C), temperature source Oral, resp. rate 18, height 5\' 4"  (1.626 m), weight 48.081 kg (106 lb), SpO2 94 %.  Wt Readings from Last 3 Encounters:  07/31/14 48.081 kg (106 lb)  08/21/11 61.236 kg (135 lb)    No intake or output data in the 24 hours ending 08/06/14 0957  Exam  General: awake  CVS: S1 S2 clear, RRR  Respiratory: CTAB  Abdomen: Soft, NT, ND, NBS Multiple stages of healing bruises   Data Review   Micro Results Recent Results (from the past 240 hour(s))  MRSA PCR Screening     Status: None   Collection Time: 08/04/14 11:30 AM  Result Value Ref Range Status   MRSA by PCR NEGATIVE NEGATIVE Final    Comment:        The GeneXpert MRSA Assay (FDA approved for NASAL specimens only), is one component of a comprehensive MRSA colonization surveillance program. It is not intended to diagnose MRSA infection nor to guide or monitor treatment for MRSA infections.     Radiology Reports Dg Shoulder Right  07/27/2014   CLINICAL DATA:  07/29/2014 this morning, bruising and pain RIGHT shoulder  EXAM: RIGHT SHOULDER - 2+ VIEW  COMPARISON:  None  FINDINGS: Diffuse osseous demineralization.  AC joint alignment normal.  Slight irregularity of the distal clavicle question age-indeterminate distal RIGHT clavicular fracture.  No additional fracture, dislocation or bone destruction.  Visualized RIGHT ribs intact.  IMPRESSION: Osseous demineralization with questionable age-indeterminate distal RIGHT clavicular fracture.  No additional focal bony abnormality.   Electronically Signed   By: Larey Seat M.D.   On: 07/27/2014 13:29   Dg Knee 1-2 Views Right  07/30/2014   CLINICAL DATA:  Acute right knee pain after fall. Initial encounter.  EXAM: RIGHT KNEE - 1-2 VIEW  COMPARISON:  None available currently.  FINDINGS: There is no evidence of fracture, dislocation, or joint effusion. There is no evidence of arthropathy or other focal bone  abnormality. Soft tissues are unremarkable.  IMPRESSION: No significant abnormality seen in the right knee.   Electronically Signed   By: 08/01/2014, M.D.   On: 07/30/2014 13:29   Ct Head Wo Contrast  07/27/2014   CLINICAL DATA:  Fall. Laceration right-sided forehead. Bruising to the right eye. Epistaxis. Initial encounter. Personal history of dementia.  EXAM: CT HEAD WITHOUT  CONTRAST  CT CERVICAL SPINE WITHOUT CONTRAST  TECHNIQUE: Multidetector CT imaging of the head and cervical spine was performed following the standard protocol without intravenous contrast. Multiplanar CT image reconstructions of the cervical spine were also generated.  COMPARISON:  MRI brain 04/19/2014  FINDINGS: CT HEAD FINDINGS  There is marked cerebral atrophy particularly involving the parietal and temporal lobes bilaterally. The ventricular system is dilated proportionate to the degree of atrophy. Moderate white matter changes are again seen bilaterally.  No acute infarct, hemorrhage, or mass lesion is present.  A posterolateral right maxillary sinus fracture is present. There is a comminuted fracture involving the anterior wall of the right maxillary sinus is well. An orbital floor fracture is suspected. The fracture extends superiorly to involve the lateral wall of the right orbit. The globe is intact. There is a segmental fracture of the right zygomatic arch.  Limited supraorbital soft tissue swelling is present without additional fractures.  CT CERVICAL SPINE FINDINGS  Cervical spine is imaged from the skullbase through T1-2. A hard collar is in place. Multilevel degenerative changes are present. Uncovertebral spurring is worse on the left at C3-4, C4-5, and C5-6. Left-sided osseous foraminal narrowing is present at these levels. No acute fracture or traumatic subluxation is present.  IMPRESSION: 1. Stable atrophy and white matter disease. The atrophy pattern is most prominent in the parietal and temporal lobes consistent with  an Alzheimer's type pattern. 2. No acute intracranial abnormality. 3. Comminuted right maxillary sinus fracture with the blood layer within the sinus. 4. Suspect right orbital floor fracture. CT of the maxillofacial sinuses would be useful for further evaluation of this type fracture. 5. Comminuted right lateral orbital wall fracture and segmental right zygomatic arch fracture. 6. Degenerative changes of the cervical spine without evidence for acute trauma.   Electronically Signed   By: Marin Roberts M.D.   On: 07/27/2014 12:59   Ct Cervical Spine Wo Contrast  07/27/2014   CLINICAL DATA:  Fall. Laceration right-sided forehead. Bruising to the right eye. Epistaxis. Initial encounter. Personal history of dementia.  EXAM: CT HEAD WITHOUT CONTRAST  CT CERVICAL SPINE WITHOUT CONTRAST  TECHNIQUE: Multidetector CT imaging of the head and cervical spine was performed following the standard protocol without intravenous contrast. Multiplanar CT image reconstructions of the cervical spine were also generated.  COMPARISON:  MRI brain 04/19/2014  FINDINGS: CT HEAD FINDINGS  There is marked cerebral atrophy particularly involving the parietal and temporal lobes bilaterally. The ventricular system is dilated proportionate to the degree of atrophy. Moderate white matter changes are again seen bilaterally.  No acute infarct, hemorrhage, or mass lesion is present.  A posterolateral right maxillary sinus fracture is present. There is a comminuted fracture involving the anterior wall of the right maxillary sinus is well. An orbital floor fracture is suspected. The fracture extends superiorly to involve the lateral wall of the right orbit. The globe is intact. There is a segmental fracture of the right zygomatic arch.  Limited supraorbital soft tissue swelling is present without additional fractures.  CT CERVICAL SPINE FINDINGS  Cervical spine is imaged from the skullbase through T1-2. A hard collar is in place. Multilevel  degenerative changes are present. Uncovertebral spurring is worse on the left at C3-4, C4-5, and C5-6. Left-sided osseous foraminal narrowing is present at these levels. No acute fracture or traumatic subluxation is present.  IMPRESSION: 1. Stable atrophy and white matter disease. The atrophy pattern is most prominent in the parietal and temporal lobes consistent with an  Alzheimer's type pattern. 2. No acute intracranial abnormality. 3. Comminuted right maxillary sinus fracture with the blood layer within the sinus. 4. Suspect right orbital floor fracture. CT of the maxillofacial sinuses would be useful for further evaluation of this type fracture. 5. Comminuted right lateral orbital wall fracture and segmental right zygomatic arch fracture. 6. Degenerative changes of the cervical spine without evidence for acute trauma.   Electronically Signed   By: Marin Roberts M.D.   On: 07/27/2014 12:59   Ct Pelvis Wo Contrast  08/04/2014   CLINICAL DATA:  Recent fall on the right side on 07/27/2014 with prior pelvic fractures. Persistent hip pain.  EXAM: CT PELVIS WITHOUT CONTRAST  TECHNIQUE: Multidetector CT imaging of the pelvis was performed following the standard protocol without intravenous contrast.  COMPARISON:  10/22/2013 lumbar spine  FINDINGS: Advanced degenerative changes of the lower lumbar spine with vacuum disc phenomenon at L5-S1. Superior endplate compression fracture of L5 with approximately 50% height loss of the vertebral body. Minimal posterior retropulsion. Fracture lines are visualized along the anterior aspect, compatible with an acute lumbar compression fracture. This does not appear to extend into the posterior elements. Facet arthropathy evident. No subluxation or dislocation.  Bilateral mild SI joint arthropathy. Sacrum, pelvis, and hips appear intact. Right lower extremity appears to have a contracture deformity. There are healed right superior and inferior rami fractures noted. No acute  hip fracture.  Included views of the pelvis demonstrate aortic and iliac atherosclerosis. Retained stool throughout the colon compatible constipation. Large volume of stool in the rectum suggest fecal impaction. Small amount of pelvic free fluid. Small right ovarian cyst measures 3.5 cm, image 38.  IMPRESSION: Acute appearing L5 compression fracture predominately superior endplate with at least 50% height loss. Minimal retropulsion posteriorly. No associated anterolisthesis.  Sacrum, pelvis and hips appear intact.  Remote right superior and inferior rami fractures.  Other intrapelvic findings as above.   Electronically Signed   By: Judie Petit.  Shick M.D.   On: 08/04/2014 22:58   Dg Hand Complete Right  07/27/2014   CLINICAL DATA:  Right hand pain post fall this morning  EXAM: RIGHT HAND - COMPLETE 3+ VIEW  COMPARISON:  07/20/2013  FINDINGS: Three views of the right hand submitted. No displaced fracture or subluxation. There is cortical irregularity distal aspect of fifth metacarpal suspicious for subtle nondisplaced fracture. Clinical correlation is necessary.  IMPRESSION: No displaced fracture or subluxation. There is cortical irregularity distal aspect of fifth metacarpal suspicious for subtle nondisplaced fracture. Clinical correlation is necessary.   Electronically Signed   By: Natasha Mead M.D.   On: 07/27/2014 13:12   Dg Foot Complete Right  07/30/2014   CLINICAL DATA:  Multiple falls with right foot pain.  EXAM: RIGHT FOOT COMPLETE - 3+ VIEW  COMPARISON:  None.  FINDINGS: Mild diffuse decreased bone mineralization. Persistent flexion at the second proximal interphalangeal joint. No definite acute fracture or dislocation.  IMPRESSION: No acute findings.   Electronically Signed   By: Elberta Fortis M.D.   On: 07/30/2014 13:31   Dg Hip Unilat With Pelvis 2-3 Views Right  07/30/2014   CLINICAL DATA:  Multiple falls. Severe pain. Patient unable to straighten RIGHT leg.  EXAM: RIGHT HIP (WITH PELVIS) 2-3 VIEWS   COMPARISON:  08/20/2011.  FINDINGS: There is no evidence of hip fracture or dislocation. There is no evidence of erosive arthropathy. Moderate callus surrounding RIGHT superior and inferior pubic ramus fractures.  IMPRESSION: Negative for acute abnormality.   Electronically Signed  By: Davonna Belling M.D.   On: 07/30/2014 13:35   Ct Maxillofacial Wo Cm  07/27/2014   CLINICAL DATA:  The patient fell and has right facial trauma with a laceration and facial fractures.  EXAM: CT MAXILLOFACIAL WITHOUT CONTRAST  TECHNIQUE: Multidetector CT imaging of the maxillofacial structures was performed. Multiplanar CT image reconstructions were also generated. A small metallic BB was placed on the right temple in order to reliably differentiate right from left.  COMPARISON:  None.  FINDINGS: There is a tripod fracture of the right facial bones with a slightly displaced fracture of the posterior aspect of the right zygomatic arch, anterior and lateral wall maxillary sinus fractures, nondisplaced hairline fractures through the medial wall of the right maxillary sinus, and minimally depressed fracture of the floor of the right orbit. The right inferior orbital rim appears to be intact. There is no entrapment of extraocular muscles of the right orbit. There is an blood fluid level in the right maxillary sinus.  Fracture of the lateral wall of the right orbit.  The other facial bones are normal.  Note is made of severe temporal lobe atrophy.  IMPRESSION: Fractures of the anterior, medial, and posterior lateral walls of the right maxillary sinus as well as of the right zygomatic arch and of the lateral wall of the right orbit. No muscle entrapment.   Electronically Signed   By: Francene Boyers M.D.   On: 07/27/2014 14:42    CBC  Recent Labs Lab 07/30/14 1149  WBC 9.3  HGB 12.3  HCT 36.1  PLT 203  MCV 98.9  MCH 33.7  MCHC 34.1  RDW 12.3  LYMPHSABS 1.1  MONOABS 0.9  EOSABS 0.0  BASOSABS 0.0    Chemistries   Recent  Labs Lab 07/30/14 1149 08/06/14 0529  NA 139  --   K 3.6  --   CL 100  --   CO2 29  --   GLUCOSE 111*  --   BUN 6  --   CREATININE 0.52 0.46*  CALCIUM 9.2  --    ------------------------------------------------------------------------------------------------------------------ estimated creatinine clearance is 49 mL/min (by C-G formula based on Cr of 0.46). ------------------------------------------------------------------------------------------------------------------ No results for input(s): HGBA1C in the last 72 hours. ------------------------------------------------------------------------------------------------------------------ No results for input(s): CHOL, HDL, LDLCALC, TRIG, CHOLHDL, LDLDIRECT in the last 72 hours. ------------------------------------------------------------------------------------------------------------------ No results for input(s): TSH, T4TOTAL, T3FREE, THYROIDAB in the last 72 hours.  Invalid input(s): FREET3 ------------------------------------------------------------------------------------------------------------------ No results for input(s): VITAMINB12, FOLATE, FERRITIN, TIBC, IRON, RETICCTPCT in the last 72 hours.  Coagulation profile No results for input(s): INR, PROTIME in the last 168 hours.  No results for input(s): DDIMER in the last 72 hours.  Cardiac Enzymes No results for input(s): CKMB, TROPONINI, MYOGLOBIN in the last 168 hours.  Invalid input(s): CK ------------------------------------------------------------------------------------------------------------------ Invalid input(s): POCBNP  No results for input(s): GLUCAP in the last 72 hours.   VANN, JESSICA DO. [facility] 08/06/2014, 9:57 AM  Pager: [phone]     After 7pm go to [email or web address] - password [email or web address]  Call night coverage person covering after 7pm

## 2014-08-06 NOTE — Clinical Social Work Note (Signed)
CSW notes that VP not appropriate for patient. CSW has made [facility] aware and [facility] plans to come evaluate the patient for return to [facility] with hospice. [facility] is also stating they will be offering the patient a bed if she is working with therapy. CSW has asked that charge RN have PT and OT work with patient today for updated clinicals.    Roddie Mc MSW, [place], [place], [phone]

## 2014-08-06 NOTE — Progress Notes (Signed)
Occupational Therapy Evaluation Patient Details Name: [name] MRN: [id number] DOB: [birth year] Today's Date: 08/06/2014    History of Present Illness Pt is 69 y/o female with hypothyroidism, RA, had a mechanical fall to the right side on 07/27/14 resulting in right superior and inferior pubic ramus fractures, and fractures of the anterior, medial and posterior lateral walls of the right maxillary sinus and of the right zygomatic arch and the lateral wall of the right orbit.  Right distal clavicular fracture, subtle nondisplaced fracture of fifth metacarpal distally.  Slightly distracted slightly displaced transverse fracture through the mid patella.   Clinical Impression   PTA, pt lived at [facility] and mobilized at [facility] level with S. Min A for ADL.(per husband) Orders for Bledsoe brace (not in room). New patellar fx noted 4/28. Need clarification on WBS for RLE with Bledsoe brace on and for RUE given 5th metacarpal fx in order to mobilize pt OOB. Long discussion with husband regarding pt's PLOF and rehab options. Noted palliative input regarding possible Hospice. Unless pt will be able to D/C to [place] with 24/7 physical assistance, she will need SNF. Will plan to see pt again tomorrow to complete assessment after WBS is clarified. Nsg notified. Will call to premedicate.     Follow Up Recommendations  SNF;Supervision/Assistance - 24 hour    Equipment Recommendations  Other (comment) (TBA)    Recommendations for Other Services       Precautions / Restrictions Precautions Precautions: Fall Required Braces or Orthoses: Other Brace/Splint (Bledsoe brace - not currently on pt) Restrictions Other Position/Activity Restrictions: none noted in chart      Mobility Bed Mobility Overal bed mobility: Needs Assistance Bed Mobility: Rolling           General bed mobility comments: Pt  moving around in bed; flexing knee reflexively; screaming out in pain when moving pt up in  bed  Transfers                 General transfer comment: need to clarify WBS    Balance                                            ADL Overall ADL's : Needs assistance/impaired     Grooming: Moderate assistance   Upper Body Bathing: Moderate assistance;Bed level   Lower Body Bathing: Maximal assistance;Bed level   Upper Body Dressing : Maximal assistance;Bed level   Lower Body Dressing: Maximal assistance;Bed level     Toilet Transfer Details (indicate cue type and reason): unable to attempt due to need for clarification of WBS orders         Functional mobility during ADLs:  (need to assess) General ADL Comments: decline in functional status     Vision     Perception     Praxis      Pertinent Vitals/Pain Pain Assessment: Faces Faces Pain Scale: Hurts whole lot Pain Location: R knee Pain Descriptors / Indicators: Grimacing;Moaning;Crying Pain Intervention(s): Limited activity within patient's tolerance;Monitored during session;Repositioned     Hand Dominance Right   Extremity/Trunk Assessment Upper Extremity Assessment Upper Extremity Assessment: RUE deficits/detail RUE Deficits / Details: RUE shoulder ROM WFL. strength not tested. ulnar gutter splint. no weight bearing noted RUE: Unable to fully assess due to immobilization;Unable to fully assess due to pain RUE Coordination: decreased fine motor   Lower Extremity Assessment Lower Extremity  Assessment: RLE deficits/detail;LLE deficits/detail RLE Deficits / Details: Pt with right knee pain with limited ROM.  Pt keeping right LE in flexed position however able to get pt to relax knee and able to reach 30 degrees of flexion (lack of extension). redness over patella   Cervical / Trunk Assessment Cervical / Trunk Assessment: Other exceptions Cervical / Trunk Exceptions: pain from compression fracture   Communication Communication Communication: Expressive difficulties (at  baseline)   Cognition Arousal/Alertness: Awake/alert Behavior During Therapy: Restless;Anxious Overall Cognitive Status: Impaired/Different from baseline Area of Impairment: Orientation;Attention;Memory;Following commands;Safety/judgement;Awareness;Problem solving Orientation Level: Disoriented to;Place;Time;Situation Current Attention Level: Sustained Memory: Decreased recall of precautions;Decreased short-term memory Following Commands: Follows one step commands with increased time Safety/Judgement: Decreased awareness of safety;Decreased awareness of deficits Awareness: Intellectual Problem Solving: Slow processing;Requires tactile cues;Requires verbal cues General Comments: Pt has basleine dementia. Husband states dementia is minimal and that she is usually able to carry on conversations and can do her basic self care with min A   General Comments       Exercises Exercises: General Upper Extremity     Shoulder Instructions      Home Living Family/patient expects to be discharged to:: Skilled nursing facility                                        Prior Functioning/Environment Level of Independence: Needs assistance  Gait / Transfers Assistance Needed: A with mobility @ RW level ADL's / Homemaking Assistance Needed: staff asssited with bathing/dresing minimally Communication / Swallowing Assistance Needed: dysarthric      OT Diagnosis: Generalized weakness;Cognitive deficits;Acute pain   OT Problem List: Decreased strength;Decreased range of motion;Decreased activity tolerance;Impaired balance (sitting and/or standing);Decreased cognition;Impaired UE functional use;Pain;Decreased knowledge of use of DME or AE   OT Treatment/Interventions: Self-care/ADL training;Therapeutic exercise;DME and/or AE instruction;Therapeutic activities;Cognitive remediation/compensation;Patient/family education;Balance training    OT Goals(Current goals can be found in the care  plan section) Acute Rehab OT Goals Patient Stated Goal: husband's goal if for pt to return to PLOF OT Goal Formulation: With family Time For Goal Achievement: 08/20/14 Potential to Achieve Goals: Fair ADL Goals Pt Will Perform Grooming: with set-up;with supervision;sitting Pt Will Perform Upper Body Bathing: with set-up;with supervision;sitting Pt Will Transfer to Toilet: with mod assist;stand pivot transfer;bedside commode Additional ADL Goal #1: Maintain upright posture EOB during ADL EOB with face scale pain less than 4  OT Frequency: Min 2X/week   Barriers to D/C:            Co-evaluation              End of Session Nurse Communication: Mobility status;Other (comment) (need for clarification on WBS)  Activity Tolerance: No increased pain;Treatment limited secondary to agitation Patient left: in bed;with call bell/phone within reach;with bed alarm set;with family/visitor present   Time: 5400-8676 OT Time Calculation (min): 33 min Charges:  OT General Charges $OT Visit: 1 Procedure OT Evaluation $Initial OT Evaluation Tier I: 1 Procedure OT Treatments $Therapeutic Activity: 8-22 mins G-Codes:    Ortha Metts,HILLARY 08/15/2014, 3:54 PM   [facility], OTR/L  [phone] 15-Aug-2014

## 2014-08-06 NOTE — Consult Note (Signed)
Patient [name]      DOB: 04-Jul-1945      DXI:[id number]   Palliative Medicine Team at [facility] Progress Note    Subjective: Reports feeling much better today.  Back not really bothering her. Pain improved.  Had large BM with manual disimpaction.   Filed Vitals:   08/06/14 0607  BP: 123/53  Pulse: 73  Temp: 98 F (36.7 C)  Resp: 18   Physical exam: GEN: alert, NAD, eating breakfast HEENT: sclera anicteric, mmm CV: RRR  LUNGS: CTAB, sym exp ABD: soft, ND EXT: warm  CBC    Component Value Date/Time   WBC 9.3 07/30/2014 1149   RBC 3.65* 07/30/2014 1149   HGB 12.3 07/30/2014 1149   HCT 36.1 07/30/2014 1149   PLT 203 07/30/2014 1149   MCV 98.9 07/30/2014 1149   MCH 33.7 07/30/2014 1149   MCHC 34.1 07/30/2014 1149   RDW 12.3 07/30/2014 1149   LYMPHSABS 1.1 07/30/2014 1149   MONOABS 0.9 07/30/2014 1149   EOSABS 0.0 07/30/2014 1149   BASOSABS 0.0 07/30/2014 1149    CMP     Component Value Date/Time   NA 139 07/30/2014 1149   K 3.6 07/30/2014 1149   CL 100 07/30/2014 1149   CO2 29 07/30/2014 1149   GLUCOSE 111* 07/30/2014 1149   BUN 6 07/30/2014 1149   CREATININE 0.46* 08/06/2014 0529   CALCIUM 9.2 07/30/2014 1149   PROT 6.2 08/20/2011 1255   ALBUMIN 3.6 08/20/2011 1255   AST 25 08/20/2011 1255   ALT 20 08/20/2011 1255   ALKPHOS 43 08/20/2011 1255   BILITOT 0.3 08/20/2011 1255   GFRNONAA >90 08/06/2014 0529   GFRAA >90 08/06/2014 0529      Assessment and plan: 69 yo female with hypothyroidism, RA who presented with fall and multiple fractures as noted.   1. Code Status- DNR  2. GOC- See discussion by Dr Phillips Odor and my note yesterday. Updated note from CSW yesterday afternoon. Dispo will be challenge. Comfort is main focus, but financial burdens of dependent care may push husband towards trying rehab. I do not think she would be residential hospice candidate.  Husband interested in hospice if placed in long term care environment.   3.  Symptom Management 1. Acute pain related to fall/fractures (R knee pain predominant)- Doing much better today.  Used less PRN. Has not gone up on fentanyl patch yet. I suspect her uncontrolled pain was 2/2 delirium. With improvement in constipation and potentially some benefit from zyprexa as well. Suspect resolution of constipation most beneficial> i will continue fentanyl patch at 3mcg/hr and PRN's which she has needed much less of.  2. Agitated Delirium- see above. Not used any PRN haldol. Will continue zyprexa for now. Improving.  3. Constipation- resolved. Would still continue current bowel regimen with doc/senna. Can consider scheduled miralax as well.  4. Back Pain- IR to eval. Not complaining of back pain today so not sure how much benefit KP or VP would be.   Orvis Brill D.O. Palliative Medicine Team at [facility]  Pager: [phone] Team Phone: [phone]

## 2014-08-07 DIAGNOSIS — E43 Unspecified severe protein-calorie malnutrition: Secondary | ICD-10-CM

## 2014-08-07 DIAGNOSIS — F0150 Vascular dementia without behavioral disturbance: Secondary | ICD-10-CM

## 2014-08-07 DIAGNOSIS — S82009A Unspecified fracture of unspecified patella, initial encounter for closed fracture: Secondary | ICD-10-CM

## 2014-08-07 MED ORDER — POLYETHYLENE GLYCOL 3350 17 G PO PACK: 17.0000 g | PACK | Freq: Every day | ORAL | Status: AC | PRN

## 2014-08-07 MED ORDER — SENNA 8.6 MG PO TABS: 2.0000 | ORAL_TABLET | Freq: Two times a day (BID) | ORAL | Status: AC

## 2014-08-07 MED ORDER — OXYCODONE HCL 20 MG/ML PO CONC: 10.0000 mg | ORAL | Status: AC | PRN

## 2014-08-07 MED ORDER — ACETAMINOPHEN 325 MG PO TABS: 650.0000 mg | ORAL_TABLET | Freq: Three times a day (TID) | ORAL | Status: AC

## 2014-08-07 MED ORDER — OLANZAPINE 5 MG PO TABS: 5.0000 mg | ORAL_TABLET | Freq: Every day | ORAL | Status: AC

## 2014-08-07 MED ORDER — TRAZODONE HCL 50 MG PO TABS: 25.0000 mg | ORAL_TABLET | Freq: Every evening | ORAL | Status: AC | PRN

## 2014-08-07 MED ORDER — HALOPERIDOL LACTATE 2 MG/ML PO CONC: 1.0000 mg | Freq: Four times a day (QID) | ORAL | Status: AC | PRN

## 2014-08-07 MED ORDER — BISACODYL 5 MG PO TBEC: 5.0000 mg | DELAYED_RELEASE_TABLET | Freq: Every day | ORAL | Status: AC

## 2014-08-07 MED ORDER — FENTANYL CITRATE (PF) 100 MCG/2ML IJ SOLN: 25.0000 ug | Freq: Once | INTRAMUSCULAR | Status: DC

## 2014-08-07 MED ORDER — FENTANYL 25 MCG/HR TD PT72: 25.0000 ug | MEDICATED_PATCH | TRANSDERMAL | Status: AC

## 2014-08-07 MED ORDER — METHOCARBAMOL 500 MG PO TABS: 500.0000 mg | ORAL_TABLET | Freq: Four times a day (QID) | ORAL | Status: AC | PRN

## 2014-08-07 MED ORDER — OXYCODONE HCL 20 MG/ML PO CONC: 10.0000 mg | ORAL | Status: DC | PRN

## 2014-08-07 MED ORDER — ENSURE ENLIVE PO LIQD: 237.0000 mL | Freq: Two times a day (BID) | ORAL | Status: AC

## 2014-08-07 NOTE — Evaluation (Signed)
Clinical/Bedside Swallow Evaluation Patient Details  Name: [name] MRN: [id number] Date of Birth: 07-10-1945  Today's Date: 08/07/2014 Time: SLP Start Time (ACUTE ONLY): 1215 SLP Stop Time (ACUTE ONLY): 1232 SLP Time Calculation (min) (ACUTE ONLY): 17 min  Past Medical History:  Past Medical History  Diagnosis Date  . Osteoporosis   . Hypothyroid   . Depression   . Rheumatoid arthritis(714.0)   . DDD (degenerative disc disease)     bone spurs in spine also   Past Surgical History:  Past Surgical History  Procedure Laterality Date  . Tonsillectomy    . Wisdom tooth extraction    . Abdominal hysterectomy      also removed left ovary  . Abdominal hysterectomy    . Orif humerus fracture  08/22/2011    Procedure: OPEN REDUCTION INTERNAL FIXATION (ORIF) HUMERAL SHAFT FRACTURE;  Surgeon: Kathryne Hitch, MD;  Location: [facility] ORS;  Service: Orthopedics;  Laterality: Left;  Open Reduction Internal Fixation Left Humeral Shaft Fracture   HPI:  Pt is 69 y/o female with hypothyroidism, RA, had a mechanical fall to the right side on 07/27/14 resulting in right superior and inferior pubic ramus fractures, and fractures of the anterior, medial and posterior lateral walls of the right maxillary sinus and of the right zygomatic arch and the lateral wall of the right orbit. Right distal clavicular fracture, subtle nondisplaced fracture of fifth metacarpal distally.   Assessment / Plan / Recommendation Clinical Impression  Pt presents with normal oropharyngeal swallow function with slowed but sufficient mastication, swift swallow trigger, and no overt s/s of aspiration.  RN reports coughing with meds this am -recommend giving meds whole in puree.  Continue regular diet, thin liquids,  no dysphagia.  SLP to sign off.     Aspiration Risk  Mild    Diet Recommendation Regular;Thin liquid   Liquid Administration via: Cup;Straw Medication Administration: Whole meds with puree Supervision:  Patient able to self feed;Staff to assist with self feeding Postural Changes and/or Swallow Maneuvers: Seated upright 90 degrees    Other  Recommendations Oral Care Recommendations: Oral care BID   Follow Up Recommendations  None     Swallow Study Prior Functional Status       General HPI: Pt is 69 y/o female with hypothyroidism, RA, had a mechanical fall to the right side on 07/27/14 resulting in right superior and inferior pubic ramus fractures, and fractures of the anterior, medial and posterior lateral walls of the right maxillary sinus and of the right zygomatic arch and the lateral wall of the right orbit. Right distal clavicular fracture, subtle nondisplaced fracture of fifth metacarpal distally. Type of Study: Bedside swallow evaluation Previous Swallow Assessment: none Diet Prior to this Study: Regular;Thin liquids Temperature Spikes Noted: No Respiratory Status: Room air Behavior/Cognition: Alert;Cooperative;Confused Oral Cavity - Dentition: Adequate natural dentition Self-Feeding Abilities: Able to feed self;Needs assist Patient Positioning: Upright in bed Baseline Vocal Quality: Clear Volitional Cough: Strong Volitional Swallow: Able to elicit    Oral/Motor/Sensory Function Overall Oral Motor/Sensory Function: Appears within functional limits for tasks assessed   Ice Chips Ice chips: Within functional limits Presentation: Spoon   Thin Liquid Thin Liquid: Within functional limits Presentation: Cup;Straw    Nectar Thick Nectar Thick Liquid: Not tested   Honey Thick Honey Thick Liquid: Not tested   Puree Puree: Within functional limits   Solid  Demitrius Crass L. [place], [place] CCC/SLP Pager [phone]     Solid: Within functional limits Presentation: Self Fed  Blenda Mounts [place] 08/07/2014,12:34 PM

## 2014-08-07 NOTE — Progress Notes (Signed)
Patient CB:[name]      DOB: Aug 17, 1945      TDV:[id number]   Palliative Medicine Team at [facility] Progress Note    Subjective: Pain better controlled. Seems less confused.  Had good BM other day. Tolerating diet and speech evaluated today.     Filed Vitals:   08/07/14 0516  BP: 140/60  Pulse: 77  Temp: 98 F (36.7 C)  Resp: 18   Physical exam: GEN: alert, NAD HEENT: [place], sclera anicteric CV: RRR LUNGS: CTAB ABD: soft, NT, ND EXT: no edema  CBC    Component Value Date/Time   WBC 9.3 07/30/2014 1149   RBC 3.65* 07/30/2014 1149   HGB 12.3 07/30/2014 1149   HCT 36.1 07/30/2014 1149   PLT 203 07/30/2014 1149   MCV 98.9 07/30/2014 1149   MCH 33.7 07/30/2014 1149   MCHC 34.1 07/30/2014 1149   RDW 12.3 07/30/2014 1149   LYMPHSABS 1.1 07/30/2014 1149   MONOABS 0.9 07/30/2014 1149   EOSABS 0.0 07/30/2014 1149   BASOSABS 0.0 07/30/2014 1149    CMP     Component Value Date/Time   NA 139 07/30/2014 1149   K 3.6 07/30/2014 1149   CL 100 07/30/2014 1149   CO2 29 07/30/2014 1149   GLUCOSE 111* 07/30/2014 1149   BUN 6 07/30/2014 1149   CREATININE 0.46* 08/06/2014 0529   CALCIUM 9.2 07/30/2014 1149   PROT 6.2 08/20/2011 1255   ALBUMIN 3.6 08/20/2011 1255   AST 25 08/20/2011 1255   ALT 20 08/20/2011 1255   ALKPHOS 43 08/20/2011 1255   BILITOT 0.3 08/20/2011 1255   GFRNONAA >90 08/06/2014 0529   GFRAA >90 08/06/2014 0529     08/06/14 R Knee XR IMPRESSION: Slightly distracted slightly displaced transverse fracture through the mid patella.    Assessment and plan: 69 yo female with hypothyroidism, RA who presented with fall and multiple fractures as noted.   1. Code Status- DNR  2. GOC- See prior discussions.  Patellar fractured noted on XRay yesterday.  PT/OT recommend SNF.  Fears remain about her ongoing ability to rehabilitate. Pain better controlled.  Unfortunately, husband does not have a lot of resources to provide 24h care outside of SNF and I  think hopes for some rehab potential.  I talked about palliative care consultation at SNF to follow her progress or lack there of.  Palliative care consult may be able to identify transition to hospice if not making progress at SNF which I think there is a high likelihood of.  Comfort and pain control remain priority for husband. I talked to him at length about these issues going forward today.    3. Symptom Management 1. Acute pain related to fall/fractures (R knee pain predominant)- Patellar fracture noted on repeat XRay today.  Being braced this afternoon. Would continue current pain regimen.  2. Agitated Delirium- resolving. Can consider weaning olanzapine at SNF.  3. Constipation- resolved. Would still continue current bowel regimen with doc/senna. Can consider scheduled miralax as well.  4. Back Pain- minimal. IR feels no role for VP/KP as unlikely to improve symptoms she is having. We also have more definitive information regarding patellar fracture.   Total Time: 25 minutes >50% of time spent in counseling and coordination of care regarding above  Orvis Brill D.O. Palliative Medicine Team at [facility]  Pager: [phone] Team Phone: [phone]

## 2014-08-07 NOTE — Discharge Instructions (Signed)
WBAT in the RLE in knee immobilizer at all times

## 2014-08-07 NOTE — Progress Notes (Signed)
Occupational Therapy Treatment Patient Details Name: RIKI BERNINGER MRN: [id number] DOB: October 29, 1945 Today's Date: 08/07/2014    History of present illness Pt is 69 y/o female with hypothyroidism, RA, had a mechanical fall to the right side on 07/27/14 resulting in right superior and inferior pubic ramus fractures, and fractures of the anterior, medial and posterior lateral walls of the right maxillary sinus and of the right zygomatic arch and the lateral wall of the right orbit.  Pt with right UE in sling with the following right distal clavicular fracture, subtle nondisplaced fracture of fifth metacarpal distally.  4/28 Slightly distracted slightly displaced transverse fracture through patella.  Patient placed in Bledsoe brace in extension - to be worn at all times.   OT comments  Treatment limited by pain. Unable to stand today. Only able to get to EOB. Pt will need 24/7 rehab at SNF to maximize functional level of independence.   Follow Up Recommendations  SNF;Supervision/Assistance - 24 hour    Equipment Recommendations  Other (comment)    Recommendations for Other Services      Precautions / Restrictions Precautions Precautions: Fall Required Braces or Orthoses: Other Brace/Splint Other Brace/Splint: Bledsoe brace RLE in extension Restrictions Weight Bearing Restrictions: Yes RUE Weight Bearing: Non weight bearing RLE Weight Bearing: Weight bearing as tolerated Other Position/Activity Restrictions: WBAT BLE with Bledsoe brace on        Mobility Bed Mobility Overal bed mobility: Needs Assistance       Supine to sit: Max assist     General bed mobility comments: Pt assisting with bed mobility and using LLE to assist with moving to EOB. Attempting to use bedrails. Pt began screaming and gently reclined back onto other therapist. Pt calmed with use of distraction. Pt reclined back to bed in supine position.  Transfers                 General transfer comment:  unable to participate due to pt screaming in pain    Balance     Sitting balance-Leahy Scale: Poor Sitting balance - Comments: unable to sit unsupported today. pushing posteirorly onto therapist                           ADL                                         General ADL Comments: total A with LB ADL; pt able to feed self with set up  Pt appears irritated with use of Bledsoe brace. Prior to donning brace, pt was keeping R knee in flexion.  Pt stating throughout session that she was "going to die". Attempted to calm and comfort pt.                                       Cognition   Behavior During Therapy: Agitated;Restless Overall Cognitive Status: Impaired/Different from baseline                  General Comments: Pt began screaming when therapist attempting to put sock on her R foot. Pt oriented to self only    Extremity/Trunk Assessment               Exercises     Shoulder Instructions  General Comments  Husband asking about rehab and discussing SNF. Listened to husband and reassured him that pt needs 24/7 care at this time    Pertinent Vitals/ Pain       Pain Assessment: Faces Faces Pain Scale: Hurts worst Pain Location: R knee Pain Descriptors / Indicators: Crying;Discomfort;Grimacing;Guarding;Moaning Pain Intervention(s): Limited activity within patient's tolerance;Repositioned  Home Living                                          Prior Functioning/Environment              Frequency Min 2X/week     Progress Toward Goals  OT Goals(current goals can now be found in the care plan section)  Progress towards OT goals: Progressing toward goals  Acute Rehab OT Goals Patient Stated Goal: husband's goal if for pt to return to PLOF OT Goal Formulation: With family Time For Goal Achievement: 08/20/14 Potential to Achieve Goals: Fair ADL Goals Pt Will Perform Grooming: with  set-up;with supervision;sitting Pt Will Perform Upper Body Bathing: with set-up;with supervision;sitting Pt Will Transfer to Toilet: with mod assist;stand pivot transfer;bedside commode Additional ADL Goal #1: Maintain upright posture EOB during ADL EOB with face scale pain less than 4  Plan Discharge plan remains appropriate    Co-evaluation    PT/OT/SLP Co-Evaluation/Treatment: Yes Reason for Co-Treatment: Necessary to address cognition/behavior during functional activity;For patient/therapist safety   OT goals addressed during session: ADL's and self-care;Other (comment) (mobility)      End of Session Equipment Utilized During Treatment: Other (comment) (bledsoe brace)   Activity Tolerance No increased pain;Patient limited by fatigue;Patient limited by pain;Treatment limited secondary to agitation   Patient Left in bed;with call bell/phone within reach;with bed alarm set;with family/visitor present   Nurse Communication Mobility status;Other (comment)        Time: 0177-9390 OT Time Calculation (min): 23 min  Charges: OT General Charges $OT Visit: 1 Procedure OT Treatments $Self Care/Home Management : 8-22 mins  Lillyanna Glandon,HILLARY 08/07/2014, 3:38 PM   [facility], OTR/L  [phone] 08/07/2014

## 2014-08-07 NOTE — Discharge Summary (Signed)
Physician Discharge Summary  [name]:[id number] DOB: [birth year] DOA: 07/30/2014  PCP: Juline Patch, MD  Admit date: 07/30/2014 Discharge date: 08/07/2014  Time spent: 35 minutes  Recommendations for Outpatient Follow-up:  1. Palliative care to follow at SNF 2. Will need ENT and ortho follow up  Discharge Diagnoses:  Principal Problem:   Right knee pain Active Problems:   Hypothyroid   Fracture of multiple pubic rami   Protein-calorie malnutrition, severe   Vascular dementia   Rheumatoid arthritis   Recurrent UTI   Immobility   Osteoporosis   Compression fx, lumbar spine   Palliative care encounter   Acute delirium   CN (constipation)   Discharge Condition: stable  Diet recommendation: regular  Filed Weights   07/31/14 1411  Weight: 48.081 kg (106 lb)    History of present illness:  [name] is a 69 y.o. female with prior h/o hypothyroidism, RA, had a mechanical fall to the right side on 4.18 and ended up having right superior and inferior pubic ramus fractures, and fractures of the anterior, medial and posterior lateral walls of the right maxillary sinus and of the right zygomatic arch and the lateral wall of the right orbit. She was started on oral hydrocodone and was seen by ENT on 4/18. She was also started on keflex for maxillary sinus wall fractures. But she reported persistent and had to be transferred to ED for pain control. In ED she was given one percocet and referred tom edical service for admission for pain control. On arrival to ED, she was in moderate distress from knee pain, tachycardic and hypertensive. Labs were unremarkable. She will be admitted to m ed surg for pain control.   Hospital Course:  Right knee pain; unclear etiology, patient had a mechanical fall on the right side on 4/18 had had several fractures including right superior and inferior pubic rami fracture and right maxillary sinus fracture, right orbital fracture - Pain control:  appreciate palliative medicine - PT evaluation recommended skilled nursing facility  -repeat x ray of knee on right showed patellar fracture- brace  Compression fx, L5- acute -not sure this is causing her pain -no need of intervention per IR  delirium -appreciate palliative care -more oriented today -PRN haldol   Hypothyroid Continue Synthroid   Fracture of multiple pubic rami - Nonoperative, PT/OT   Depression with dementia - Continue Cymbalta, Aricept  Recent fracture of inferior, medial, posterior lateral walls of right maxilla sinus, right zygomatic arch, lateral wall of the right orbit - Continue Keflex as recommended by ENT   right distal clavicular fracture, subtle nondisplaced fracture of fifth metacarpal distally - Continue shoulder immobilizer for comfort, wrist splint  Spoke with husband several times.  He had refused brace initially.  Wanted patient to return to assisted living at [facility].  Explained that she needed a higher level of care he seemed to understand  Procedures:    Consultations:  Ortho   IR  Palliative   Discharge Exam: Filed Vitals:   08/07/14 0516  BP: 140/60  Pulse: 77  Temp: 98 F (36.7 C)  Resp: 18    General: awake, pain more controlled Cardiovascular: rrr Respiratory: no wheezing Multiple areas of bruising  Discharge Instructions   Discharge Instructions    Diet general    Complete by:  As directed      Discharge instructions    Complete by:  As directed   Palliative services to follow at SNF [facility] as tolerated Wear brace  on right knee (bledsoe)     Increase activity slowly    Complete by:  As directed           Current Discharge Medication List    START taking these medications   Details  bisacodyl (DULCOLAX) 5 MG EC tablet Take 1 tablet (5 mg total) by mouth daily. Qty: 30 tablet, Refills: 0    feeding supplement, ENSURE ENLIVE, (ENSURE ENLIVE) LIQD Take 237 mLs by mouth 2 (two) times daily  between meals. Qty: 237 mL, Refills: 12    fentaNYL (DURAGESIC - DOSED MCG/HR) 25 MCG/HR patch Place 1 patch (25 mcg total) onto the skin every 3 (three) days. Qty: 5 patch, Refills: 0    haloperidol (HALDOL) 2 MG/ML solution Take 0.5-1 mLs (1-2 mg total) by mouth every 6 (six) hours as needed for agitation. Refills: 0    methocarbamol (ROBAXIN) 500 MG tablet Take 1 tablet (500 mg total) by mouth every 6 (six) hours as needed for muscle spasms. Qty: 20 tablet, Refills: 0    OLANZapine (ZYPREXA) 5 MG tablet Take 1 tablet (5 mg total) by mouth at bedtime.    oxyCODONE (ROXICODONE INTENSOL) 20 MG/ML concentrated solution Take 0.5 mLs (10 mg total) by mouth every 4 (four) hours as needed for breakthrough pain (or prior to rehab/therapy attempts). Refills: 0    polyethylene glycol (MIRALAX / GLYCOLAX) packet Take 17 g by mouth daily as needed for moderate constipation. Qty: 14 each, Refills: 0    senna (SENOKOT) 8.6 MG TABS tablet Take 2 tablets (17.2 mg total) by mouth 2 (two) times daily. Qty: 120 each, Refills: 0    traZODone (DESYREL) 50 MG tablet Take 0.5 tablets (25 mg total) by mouth at bedtime as needed for sleep. Qty: 15 tablet, Refills: 0      CONTINUE these medications which have CHANGED   Details  acetaminophen (TYLENOL) 325 MG tablet Take 2 tablets (650 mg total) by mouth 3 (three) times daily.      CONTINUE these medications which have NOT CHANGED   Details  aspirin 81 MG chewable tablet Chew 81 mg by mouth daily.    Calcium Carb-Cholecalciferol 600-200 MG-UNIT TABS Take 1 tablet by mouth daily.    Calcium Carbonate 500 MG CHEW Chew 1 tablet by mouth daily as needed (heartburn).    cephALEXin (KEFLEX) 500 MG capsule Take 1 capsule (500 mg total) by mouth 3 (three) times daily. Qty: 15 capsule, Refills: 0    cholecalciferol (VITAMIN D) 1000 UNITS tablet Take 2,000 Units by mouth daily.    Cyanocobalamin (VITAMIN B 12 PO) Take 1 tablet by mouth daily.    docusate  sodium (COLACE) 100 MG capsule Take 200 mg by mouth at bedtime.    donepezil (ARICEPT) 5 MG tablet Take 5 mg by mouth at bedtime.    DULoxetine (CYMBALTA) 60 MG capsule Take 60 mg by mouth daily.    fluticasone (FLONASE) 50 MCG/ACT nasal spray Place 1 spray into both nostrils 2 (two) times daily.    folic acid (FOLVITE) 1 MG tablet Take 1 mg by mouth daily.    levothyroxine (SYNTHROID, LEVOTHROID) 88 MCG tablet Take 88 mcg by mouth daily before breakfast.    methotrexate (RHEUMATREX) 2.5 MG tablet Take 7.5 mg by mouth once a week. Take on Mondays    prenatal vitamin w/FE, FA (PRENATAL 1 + 1) 27-1 MG TABS Take 1 tablet by mouth daily.    magnesium oxide (MAG-OX) 400 MG tablet Take 400 mg by mouth  daily.      STOP taking these medications     diclofenac (FLECTOR) 1.3 % PTCH      etanercept (ENBREL) 50 MG/ML injection      HYDROcodone-acetaminophen (NORCO/VICODIN) 5-325 MG per tablet        Allergies  Allergen Reactions  . Sulfa Antibiotics Nausea Only      The results of significant diagnostics from this hospitalization (including imaging, microbiology, ancillary and laboratory) are listed below for reference.    Significant Diagnostic Studies: Dg Shoulder Right  07/27/2014   CLINICAL DATA:  Larey Seat this morning, bruising and pain RIGHT shoulder  EXAM: RIGHT SHOULDER - 2+ VIEW  COMPARISON:  None  FINDINGS: Diffuse osseous demineralization.  AC joint alignment normal.  Slight irregularity of the distal clavicle question age-indeterminate distal RIGHT clavicular fracture.  No additional fracture, dislocation or bone destruction.  Visualized RIGHT ribs intact.  IMPRESSION: Osseous demineralization with questionable age-indeterminate distal RIGHT clavicular fracture.  No additional focal bony abnormality.   Electronically Signed   By: Ulyses Southward M.D.   On: 07/27/2014 13:29   Dg Knee 1-2 Views Right  08/06/2014   CLINICAL DATA:  Right knee pain and swelling.  Recent fall.  EXAM:  RIGHT KNEE - 1-2 VIEW  COMPARISON:  07/30/2014 and 02/21/2013 and MRI dated 03/15/2013  FINDINGS: There is a transverse slightly distracted fracture through the mid patella. There are degenerative changes of both femoral condyles. Proximal tibia and fibula are normal other than osteopenia.  IMPRESSION: Slightly distracted slightly displaced transverse fracture through the mid patella.   Electronically Signed   By: Francene Boyers M.D.   On: 08/06/2014 12:50   Dg Knee 1-2 Views Right  07/30/2014   CLINICAL DATA:  Acute right knee pain after fall. Initial encounter.  EXAM: RIGHT KNEE - 1-2 VIEW  COMPARISON:  None available currently.  FINDINGS: There is no evidence of fracture, dislocation, or joint effusion. There is no evidence of arthropathy or other focal bone abnormality. Soft tissues are unremarkable.  IMPRESSION: No significant abnormality seen in the right knee.   Electronically Signed   By: Lupita Raider, M.D.   On: 07/30/2014 13:29   Ct Head Wo Contrast  07/27/2014   CLINICAL DATA:  Fall. Laceration right-sided forehead. Bruising to the right eye. Epistaxis. Initial encounter. Personal history of dementia.  EXAM: CT HEAD WITHOUT CONTRAST  CT CERVICAL SPINE WITHOUT CONTRAST  TECHNIQUE: Multidetector CT imaging of the head and cervical spine was performed following the standard protocol without intravenous contrast. Multiplanar CT image reconstructions of the cervical spine were also generated.  COMPARISON:  MRI brain 04/19/2014  FINDINGS: CT HEAD FINDINGS  There is marked cerebral atrophy particularly involving the parietal and temporal lobes bilaterally. The ventricular system is dilated proportionate to the degree of atrophy. Moderate white matter changes are again seen bilaterally.  No acute infarct, hemorrhage, or mass lesion is present.  A posterolateral right maxillary sinus fracture is present. There is a comminuted fracture involving the anterior wall of the right maxillary sinus is well. An  orbital floor fracture is suspected. The fracture extends superiorly to involve the lateral wall of the right orbit. The globe is intact. There is a segmental fracture of the right zygomatic arch.  Limited supraorbital soft tissue swelling is present without additional fractures.  CT CERVICAL SPINE FINDINGS  Cervical spine is imaged from the skullbase through T1-2. A hard collar is in place. Multilevel degenerative changes are present. Uncovertebral spurring is worse on the  left at C3-4, C4-5, and C5-6. Left-sided osseous foraminal narrowing is present at these levels. No acute fracture or traumatic subluxation is present.  IMPRESSION: 1. Stable atrophy and white matter disease. The atrophy pattern is most prominent in the parietal and temporal lobes consistent with an Alzheimer's type pattern. 2. No acute intracranial abnormality. 3. Comminuted right maxillary sinus fracture with the blood layer within the sinus. 4. Suspect right orbital floor fracture. CT of the maxillofacial sinuses would be useful for further evaluation of this type fracture. 5. Comminuted right lateral orbital wall fracture and segmental right zygomatic arch fracture. 6. Degenerative changes of the cervical spine without evidence for acute trauma.   Electronically Signed   By: Marin Roberts M.D.   On: 07/27/2014 12:59   Ct Cervical Spine Wo Contrast  07/27/2014   CLINICAL DATA:  Fall. Laceration right-sided forehead. Bruising to the right eye. Epistaxis. Initial encounter. Personal history of dementia.  EXAM: CT HEAD WITHOUT CONTRAST  CT CERVICAL SPINE WITHOUT CONTRAST  TECHNIQUE: Multidetector CT imaging of the head and cervical spine was performed following the standard protocol without intravenous contrast. Multiplanar CT image reconstructions of the cervical spine were also generated.  COMPARISON:  MRI brain 04/19/2014  FINDINGS: CT HEAD FINDINGS  There is marked cerebral atrophy particularly involving the parietal and temporal  lobes bilaterally. The ventricular system is dilated proportionate to the degree of atrophy. Moderate white matter changes are again seen bilaterally.  No acute infarct, hemorrhage, or mass lesion is present.  A posterolateral right maxillary sinus fracture is present. There is a comminuted fracture involving the anterior wall of the right maxillary sinus is well. An orbital floor fracture is suspected. The fracture extends superiorly to involve the lateral wall of the right orbit. The globe is intact. There is a segmental fracture of the right zygomatic arch.  Limited supraorbital soft tissue swelling is present without additional fractures.  CT CERVICAL SPINE FINDINGS  Cervical spine is imaged from the skullbase through T1-2. A hard collar is in place. Multilevel degenerative changes are present. Uncovertebral spurring is worse on the left at C3-4, C4-5, and C5-6. Left-sided osseous foraminal narrowing is present at these levels. No acute fracture or traumatic subluxation is present.  IMPRESSION: 1. Stable atrophy and white matter disease. The atrophy pattern is most prominent in the parietal and temporal lobes consistent with an Alzheimer's type pattern. 2. No acute intracranial abnormality. 3. Comminuted right maxillary sinus fracture with the blood layer within the sinus. 4. Suspect right orbital floor fracture. CT of the maxillofacial sinuses would be useful for further evaluation of this type fracture. 5. Comminuted right lateral orbital wall fracture and segmental right zygomatic arch fracture. 6. Degenerative changes of the cervical spine without evidence for acute trauma.   Electronically Signed   By: Marin Roberts M.D.   On: 07/27/2014 12:59   Ct Pelvis Wo Contrast  08/04/2014   CLINICAL DATA:  Recent fall on the right side on 07/27/2014 with prior pelvic fractures. Persistent hip pain.  EXAM: CT PELVIS WITHOUT CONTRAST  TECHNIQUE: Multidetector CT imaging of the pelvis was performed following  the standard protocol without intravenous contrast.  COMPARISON:  10/22/2013 lumbar spine  FINDINGS: Advanced degenerative changes of the lower lumbar spine with vacuum disc phenomenon at L5-S1. Superior endplate compression fracture of L5 with approximately 50% height loss of the vertebral body. Minimal posterior retropulsion. Fracture lines are visualized along the anterior aspect, compatible with an acute lumbar compression fracture. This does not appear  to extend into the posterior elements. Facet arthropathy evident. No subluxation or dislocation.  Bilateral mild SI joint arthropathy. Sacrum, pelvis, and hips appear intact. Right lower extremity appears to have a contracture deformity. There are healed right superior and inferior rami fractures noted. No acute hip fracture.  Included views of the pelvis demonstrate aortic and iliac atherosclerosis. Retained stool throughout the colon compatible constipation. Large volume of stool in the rectum suggest fecal impaction. Small amount of pelvic free fluid. Small right ovarian cyst measures 3.5 cm, image 38.  IMPRESSION: Acute appearing L5 compression fracture predominately superior endplate with at least 50% height loss. Minimal retropulsion posteriorly. No associated anterolisthesis.  Sacrum, pelvis and hips appear intact.  Remote right superior and inferior rami fractures.  Other intrapelvic findings as above.   Electronically Signed   By: Judie Petit.  Shick M.D.   On: 08/04/2014 22:58   Dg Hand Complete Right  07/27/2014   CLINICAL DATA:  Right hand pain post fall this morning  EXAM: RIGHT HAND - COMPLETE 3+ VIEW  COMPARISON:  07/20/2013  FINDINGS: Three views of the right hand submitted. No displaced fracture or subluxation. There is cortical irregularity distal aspect of fifth metacarpal suspicious for subtle nondisplaced fracture. Clinical correlation is necessary.  IMPRESSION: No displaced fracture or subluxation. There is cortical irregularity distal aspect of  fifth metacarpal suspicious for subtle nondisplaced fracture. Clinical correlation is necessary.   Electronically Signed   By: Natasha Mead M.D.   On: 07/27/2014 13:12   Dg Foot Complete Right  07/30/2014   CLINICAL DATA:  Multiple falls with right foot pain.  EXAM: RIGHT FOOT COMPLETE - 3+ VIEW  COMPARISON:  None.  FINDINGS: Mild diffuse decreased bone mineralization. Persistent flexion at the second proximal interphalangeal joint. No definite acute fracture or dislocation.  IMPRESSION: No acute findings.   Electronically Signed   By: Elberta Fortis M.D.   On: 07/30/2014 13:31   Dg Hip Unilat With Pelvis 2-3 Views Right  07/30/2014   CLINICAL DATA:  Multiple falls. Severe pain. Patient unable to straighten RIGHT leg.  EXAM: RIGHT HIP (WITH PELVIS) 2-3 VIEWS  COMPARISON:  08/20/2011.  FINDINGS: There is no evidence of hip fracture or dislocation. There is no evidence of erosive arthropathy. Moderate callus surrounding RIGHT superior and inferior pubic ramus fractures.  IMPRESSION: Negative for acute abnormality.   Electronically Signed   By: Davonna Belling M.D.   On: 07/30/2014 13:35   Ct Maxillofacial Wo Cm  07/27/2014   CLINICAL DATA:  The patient fell and has right facial trauma with a laceration and facial fractures.  EXAM: CT MAXILLOFACIAL WITHOUT CONTRAST  TECHNIQUE: Multidetector CT imaging of the maxillofacial structures was performed. Multiplanar CT image reconstructions were also generated. A small metallic BB was placed on the right temple in order to reliably differentiate right from left.  COMPARISON:  None.  FINDINGS: There is a tripod fracture of the right facial bones with a slightly displaced fracture of the posterior aspect of the right zygomatic arch, anterior and lateral wall maxillary sinus fractures, nondisplaced hairline fractures through the medial wall of the right maxillary sinus, and minimally depressed fracture of the floor of the right orbit. The right inferior orbital rim appears to  be intact. There is no entrapment of extraocular muscles of the right orbit. There is an blood fluid level in the right maxillary sinus.  Fracture of the lateral wall of the right orbit.  The other facial bones are normal.  Note is made  of severe temporal lobe atrophy.  IMPRESSION: Fractures of the anterior, medial, and posterior lateral walls of the right maxillary sinus as well as of the right zygomatic arch and of the lateral wall of the right orbit. No muscle entrapment.   Electronically Signed   By: Francene Boyers M.D.   On: 07/27/2014 14:42    Microbiology: Recent Results (from the past 240 hour(s))  MRSA PCR Screening     Status: None   Collection Time: 08/04/14 11:30 AM  Result Value Ref Range Status   MRSA by PCR NEGATIVE NEGATIVE Final    Comment:        The GeneXpert MRSA Assay (FDA approved for NASAL specimens only), is one component of a comprehensive MRSA colonization surveillance program. It is not intended to diagnose MRSA infection nor to guide or monitor treatment for MRSA infections.      Labs: Basic Metabolic Panel:  Recent Labs Lab 08/06/14 0529  CREATININE 0.46*   Liver Function Tests: No results for input(s): AST, ALT, ALKPHOS, BILITOT, PROT, ALBUMIN in the last 168 hours. No results for input(s): LIPASE, AMYLASE in the last 168 hours. No results for input(s): AMMONIA in the last 168 hours. CBC: No results for input(s): WBC, NEUTROABS, HGB, HCT, MCV, PLT in the last 168 hours. Cardiac Enzymes: No results for input(s): CKTOTAL, CKMB, CKMBINDEX, TROPONINI in the last 168 hours. BNP: BNP (last 3 results) No results for input(s): BNP in the last 8760 hours.  ProBNP (last 3 results) No results for input(s): PROBNP in the last 8760 hours.  CBG: No results for input(s): GLUCAP in the last 168 hours.     SignedMarlin Canary  [facility] 08/07/2014, 12:00 PM

## 2014-08-07 NOTE — Progress Notes (Signed)
Physical Therapy Treatment Patient Details Name: [name] MRN: [id number] DOB: 11/14/1945 Today's Date: 08/07/2014    History of Present Illness Pt is 69 y/o female with hypothyroidism, RA, had a mechanical fall to the right side on 07/27/14 resulting in right superior and inferior pubic ramus fractures, and fractures of the anterior, medial and posterior lateral walls of the right maxillary sinus and of the right zygomatic arch and the lateral wall of the right orbit.  Pt with right UE in sling with the following right distal clavicular fracture, subtle nondisplaced fracture of fifth metacarpal distally.  4/28 Slightly distracted slightly displaced transverse fracture through patella.  Patient placed in Bledsoe brace in extension - to be worn at all times.    PT Comments    Session limited today due to pain.  Able to move to EOB, but unable to attempt standing.  Bledsoe brace in place.  Follow Up Recommendations  SNF     Equipment Recommendations  None recommended by PT    Recommendations for Other Services       Precautions / Restrictions Precautions Precautions: Fall Required Braces or Orthoses: Other Brace/Splint Other Brace/Splint: Bledsoe brace RLE in extension Restrictions Weight Bearing Restrictions: Yes RUE Weight Bearing: Non weight bearing RLE Weight Bearing: Weight bearing as tolerated Other Position/Activity Restrictions: WBAT BLE with Bledsoe brace on     Mobility  Bed Mobility Overal bed mobility: Needs Assistance Bed Mobility: Supine to Sit;Sit to Supine     Supine to sit: Max assist Sit to supine: Max assist   General bed mobility comments: Pt assisting with bed mobility and using LLE to assist with moving to EOB. Pt began screaming and gently reclined back onto other therapist. Pt calmed with use of distraction. Pt reclined back to bed  Transfers                 General transfer comment: unable to participate due to pt screaming in  pain  Ambulation/Gait                 Stairs            Wheelchair Mobility    Modified Rankin (Stroke Patients Only)       Balance   Sitting-balance support: Single extremity supported;Feet unsupported Sitting balance-Leahy Scale: Poor Sitting balance - Comments: unable to sit unsupported today. pushing posteirorly onto therapist Postural control: Posterior lean                          Cognition Arousal/Alertness: Awake/alert Behavior During Therapy: Agitated;Restless Overall Cognitive Status: Impaired/Different from baseline Area of Impairment: Orientation;Attention;Safety/judgement;Awareness;Problem solving;Memory Orientation Level: Disoriented to;Place;Time;Situation Current Attention Level: Sustained Memory: Decreased recall of precautions;Decreased short-term memory Following Commands: Follows one step commands with increased time Safety/Judgement: Decreased awareness of safety;Decreased awareness of deficits   Problem Solving: Slow processing;Requires tactile cues;Requires verbal cues General Comments: Patient yelling out with any touch or movement (putting sock on foot, raising HOB). Stopped yelling with distraction.    Exercises      General Comments        Pertinent Vitals/Pain Pain Assessment: Faces Faces Pain Scale: Hurts worst Pain Location: Rt knee Pain Descriptors / Indicators: Crying;Grimacing (Yelling out) Pain Intervention(s): Limited activity within patient's tolerance;Repositioned    Home Living                      Prior Function  PT Goals (current goals can now be found in the care plan section) Acute Rehab PT Goals Patient Stated Goal: husband's goal if for pt to return to PLOF Progress towards PT goals: Not progressing toward goals - comment (due to pain)    Frequency  Min 3X/week    PT Plan Current plan remains appropriate    Co-evaluation   Reason for Co-Treatment: Necessary to  address cognition/behavior during functional activity;For patient/therapist safety   OT goals addressed during session: ADL's and self-care;Other (comment) (mobility)     End of Session   Activity Tolerance: No increased pain;Treatment limited secondary to agitation Patient left: in bed;with call bell/phone within reach;with bed alarm set;with family/visitor present     Time: [id number] PT Time Calculation (min) (ACUTE ONLY): 16 min  Charges:  $Therapeutic Activity: 8-22 mins                    G Codes:      Vena Austria 16-Aug-2014, 4:20 PM Durenda Hurt. Renaldo Fiddler, [facility] Pager [phone]

## 2014-08-07 NOTE — Clinical Social Work Placement (Signed)
   CLINICAL SOCIAL WORK PLACEMENT  NOTE  Date:  08/07/2014  Patient Details  Name: [name] MRN: [id number] Date of Birth: [birth year]  Clinical Social Work is seeking post-discharge placement for this patient at the Skilled  Nursing Facility level of care (*CSW will initial, date and re-position this form in  chart as items are completed):  Yes   Patient/family provided with [facility] Social Work Department's list of facilities offering this level of care within the geographic area requested by the patient (or if unable, by the patient's family).  Yes   Patient/family informed of their freedom to choose among providers that offer the needed level of care, that participate in Medicare, Medicaid or managed care program needed by the patient, have an available bed and are willing to accept the patient.  Yes   Patient/family informed of [facility]'s ownership interest in [facility] and [facility], as well as of the fact that they are under no obligation to receive care at these facilities.  PASRR submitted to EDS on       PASRR number received on       Existing PASRR number confirmed on 07/31/14     FL2 transmitted to all facilities in geographic area requested by pt/family on       FL2 transmitted to all facilities within larger geographic area on 07/31/14     Patient informed that his/her managed care company has contracts with or will negotiate with certain facilities, including the following:        Yes   Patient/family informed of bed offers received.  Patient chooses bed at [facility]     Physician recommends and patient chooses bed at      Patient to be transferred to [facility] on 08/07/14.  Patient to be transferred to facility by Ambulance     Patient family notified on 08/07/14 of transfer.  Name of family member notified:  Nelida Meuse     PHYSICIAN Please sign DNR, Please sign FL2, Please prepare prescriptions, Please prepare priority  discharge summary, including medications     Additional Comment:  Per MD patient ready for DC to [facility]. RN, patient, patient's family, and facility notified of DC. RN given number for report. DC packet on chart. Ambulance transport requested for patient for 4:30PM. CSW signing off.   _______________________________________________ Venita Lick, LCSW 08/07/2014, 3:12 PM

## 2014-08-07 NOTE — Progress Notes (Signed)
     Subjective:  Patella fracture of right knee. Patient reports pain as mild to moderate.  Resting comfortably in bed.    Objective:   VITALS:   Filed Vitals:   08/06/14 1422 08/06/14 2117 08/07/14 0516 08/07/14 1351  BP: 149/58 146/54 140/60 115/51  Pulse: 94 78 77 85  Temp: 97.9 F (36.6 C) 98 F (36.7 C) 98 F (36.7 C) 98.7 F (37.1 C)  TempSrc:  Axillary Axillary Oral  Resp: 16 18 18 20   Height:      Weight:      SpO2: 98% 98% 99% 98%    ABD soft Neurovascular intact Sensation intact distally Intact pulses distally Dorsiflexion/Plantar flexion intact Right knee in bledso knee brace at full extension  Lab Results  Component Value Date   WBC 9.3 07/30/2014   HGB 12.3 07/30/2014   HCT 36.1 07/30/2014   MCV 98.9 07/30/2014   PLT 203 07/30/2014   BMET    Component Value Date/Time   NA 139 07/30/2014 1149   K 3.6 07/30/2014 1149   CL 100 07/30/2014 1149   CO2 29 07/30/2014 1149   GLUCOSE 111* 07/30/2014 1149   BUN 6 07/30/2014 1149   CREATININE 0.46* 08/06/2014 0529   CALCIUM 9.2 07/30/2014 1149   GFRNONAA >90 08/06/2014 0529   GFRAA >90 08/06/2014 0529     Assessment/Plan:     Principal Problem:   Right knee pain Active Problems:   Hypothyroid   Fracture of multiple pubic rami   Protein-calorie malnutrition, severe   Vascular dementia   Rheumatoid arthritis   Recurrent UTI   Immobility   Osteoporosis   Compression fx, lumbar spine   Palliative care encounter   Acute delirium   CN (constipation)   Patellar fracture   Up with therapy WBAT in the RLE in knee immobilizer at all times Will follow up as outpatient in 2 weeks, please call with any questions or concerns.   Kortney Potvin 08/08/2014 08/07/2014, 2:23 PM Cell [phone]

## 2014-08-07 NOTE — Progress Notes (Signed)
Report given to [facility] nurse [facility].

## 2014-12-03 ENCOUNTER — Emergency Department (HOSPITAL_COMMUNITY): Payer: Medicare PPO

## 2014-12-03 ENCOUNTER — Encounter (HOSPITAL_COMMUNITY): Payer: Self-pay | Admitting: *Deleted

## 2014-12-03 ENCOUNTER — Emergency Department (HOSPITAL_COMMUNITY)
Admission: EM | Admit: 2014-12-03 | Discharge: 2014-12-03 | Disposition: A | Discharge status: Home / Self Care | Payer: Medicare PPO | Attending: Emergency Medicine | Admitting: Emergency Medicine

## 2014-12-03 DIAGNOSIS — Y9289 Other specified places as the place of occurrence of the external cause: Secondary | ICD-10-CM | POA: Insufficient documentation

## 2014-12-03 DIAGNOSIS — Z7982 Long term (current) use of aspirin: Secondary | ICD-10-CM | POA: Diagnosis not present

## 2014-12-03 DIAGNOSIS — E039 Hypothyroidism, unspecified: Secondary | ICD-10-CM | POA: Diagnosis not present

## 2014-12-03 DIAGNOSIS — W050XXA Fall from non-moving wheelchair, initial encounter: Secondary | ICD-10-CM | POA: Insufficient documentation

## 2014-12-03 DIAGNOSIS — S0081XA Abrasion of other part of head, initial encounter: Secondary | ICD-10-CM | POA: Diagnosis not present

## 2014-12-03 DIAGNOSIS — Y9389 Activity, other specified: Secondary | ICD-10-CM | POA: Insufficient documentation

## 2014-12-03 DIAGNOSIS — Z79899 Other long term (current) drug therapy: Secondary | ICD-10-CM | POA: Insufficient documentation

## 2014-12-03 DIAGNOSIS — Y998 Other external cause status: Secondary | ICD-10-CM | POA: Insufficient documentation

## 2014-12-03 DIAGNOSIS — S0990XA Unspecified injury of head, initial encounter: Secondary | ICD-10-CM | POA: Diagnosis present

## 2014-12-03 DIAGNOSIS — Z87891 Personal history of nicotine dependence: Secondary | ICD-10-CM | POA: Diagnosis not present

## 2014-12-03 DIAGNOSIS — F329 Major depressive disorder, single episode, unspecified: Secondary | ICD-10-CM | POA: Insufficient documentation

## 2014-12-03 DIAGNOSIS — Z7951 Long term (current) use of inhaled steroids: Secondary | ICD-10-CM | POA: Diagnosis not present

## 2014-12-03 DIAGNOSIS — Z792 Long term (current) use of antibiotics: Secondary | ICD-10-CM | POA: Insufficient documentation

## 2014-12-03 LAB — CBG MONITORING, ED: Glucose-Capillary: 112 mg/dL — ABNORMAL HIGH (ref 65–99)

## 2014-12-03 MED ORDER — FENTANYL CITRATE (PF) 100 MCG/2ML IJ SOLN
50.0000 ug | Freq: Once | INTRAMUSCULAR | Status: AC
  Administered 2014-12-03: 50 ug via INTRAMUSCULAR
  Filled 2014-12-03: qty 2

## 2014-12-03 NOTE — ED Provider Notes (Signed)
CSN: [id number]     Arrival date & time 12/03/14  1633 History   First MD Initiated Contact with Patient 12/03/14 1637     Chief Complaint  Patient presents with  . Fall    near-syncope     (Consider location/radiation/quality/duration/timing/severity/associated sxs/prior Treatment) HPI   69 year old female presenting after a fall. Her husband is present at bedside and witnessed this fall. Patient was seated in a wheelchair. She bent forward and fell out of it. She struck the left side of her face. No loss of consciousness. She has a history of advanced dementia. She seems to be at her baseline per his report. No vomiting. Takes a baby aspirin. Patient has a history of rheumatoid arthritis and chronic pain. Patient did not voice any acute complaint to hospital aside from her head hurting.   Past Medical History  Diagnosis Date  . Osteoporosis   . Hypothyroid   . Depression   . Rheumatoid arthritis(714.0)   . DDD (degenerative disc disease)     bone spurs in spine also   Past Surgical History  Procedure Laterality Date  . Tonsillectomy    . Wisdom tooth extraction    . Abdominal hysterectomy      also removed left ovary  . Abdominal hysterectomy    . Orif humerus fracture  08/22/2011    Procedure: OPEN REDUCTION INTERNAL FIXATION (ORIF) HUMERAL SHAFT FRACTURE;  Surgeon: Kathryne Hitch, MD;  Location: [facility] ORS;  Service: Orthopedics;  Laterality: Left;  Open Reduction Internal Fixation Left Humeral Shaft Fracture   Family History  Problem Relation Age of Onset  . Heart attack Mother   . Cancer Father   . Heart attack Brother   . Cancer Other   . Heart attack Other    Social History  Substance Use Topics  . Smoking status: Former Smoker -- 2.00 packs/day    Types: Cigarettes    Quit date: 05/15/1991  . Smokeless tobacco: Never Used  . Alcohol Use: Yes     Comment: sober for 30 years  history of alcoholism   OB History    No data available     Review of  Systems  Level V caveat because of advanced dementia.  Allergies  Sulfa antibiotics  Home Medications   Prior to Admission medications   Medication Sig Start Date End Date Taking? Authorizing Provider  aspirin 81 MG chewable tablet Chew 81 mg by mouth daily.   Yes Historical Provider, MD  bisacodyl (DULCOLAX) 5 MG EC tablet Take 1 tablet (5 mg total) by mouth daily. 08/07/14  Yes Joseph Art, DO  Calcium Carb-Cholecalciferol 600-200 MG-UNIT TABS Take 1 tablet by mouth daily.   Yes Historical Provider, MD  Cholecalciferol (VITAMIN D) 2000 UNITS CAPS Take 2,000 Units by mouth daily at 12 noon.   Yes Historical Provider, MD  Cyanocobalamin (VITAMIN B 12 PO) Take 1,000 mcg by mouth daily.    Yes Historical Provider, MD  divalproex (DEPAKOTE) 125 MG DR tablet Take 125 mg by mouth 2 (two) times daily. 11/03/14  Yes Historical Provider, MD  docusate sodium (COLACE) 100 MG capsule Take 200 mg by mouth at bedtime.   Yes Historical Provider, MD  ENBREL SURECLICK 50 MG/ML injection Inject 50 mg into the muscle once a week. Tuesdays 11/25/14  Yes Historical Provider, MD  FLECTOR 1.3 % PTCH Place 1 patch onto the skin daily at 12 noon. 11/03/14  Yes Historical Provider, MD  folic acid (FOLVITE) 1 MG tablet Take 1  mg by mouth daily.   Yes Historical Provider, MD  levothyroxine (SYNTHROID, LEVOTHROID) 88 MCG tablet Take 88 mcg by mouth daily before breakfast.   Yes Historical Provider, MD  magnesium oxide (MAG-OX) 400 MG tablet Take 400 mg by mouth daily.   Yes Historical Provider, MD  Melatonin 5 MG CAPS Take 5 mg by mouth at bedtime.   Yes Historical Provider, MD  methotrexate (RHEUMATREX) 2.5 MG tablet Take 7.5 mg by mouth once a week. Take on Mondays   Yes Historical Provider, MD  mirtazapine (REMERON) 30 MG tablet Take 30 mg by mouth at bedtime. 11/09/14  Yes Historical Provider, MD  prenatal vitamin w/FE, FA (PRENATAL 1 + 1) 27-1 MG TABS Take 1 tablet by mouth daily.   Yes Historical Provider, MD   senna (SENOKOT) 8.6 MG TABS tablet Take 2 tablets (17.2 mg total) by mouth 2 (two) times daily. 08/07/14  Yes Joseph Art, DO  acetaminophen (TYLENOL) 325 MG tablet Take 2 tablets (650 mg total) by mouth 3 (three) times daily. 08/07/14   Joseph Art, DO  Calcium Carbonate 500 MG CHEW Chew 1 tablet by mouth daily as needed (heartburn).    Historical Provider, MD  cephALEXin (KEFLEX) 500 MG capsule Take 1 capsule (500 mg total) by mouth 3 (three) times daily. 07/27/14   Eyvonne Mechanic, PA-C  DULoxetine (CYMBALTA) 60 MG capsule Take 60 mg by mouth daily.    Historical Provider, MD  feeding supplement, ENSURE ENLIVE, (ENSURE ENLIVE) LIQD Take 237 mLs by mouth 2 (two) times daily between meals. 08/07/14   Joseph Art, DO  fentaNYL (DURAGESIC - DOSED MCG/HR) 25 MCG/HR patch Place 1 patch (25 mcg total) onto the skin every 3 (three) days. 08/07/14   Joseph Art, DO  fluticasone (FLONASE) 50 MCG/ACT nasal spray Place 1 spray into both nostrils 2 (two) times daily.    Historical Provider, MD  haloperidol (HALDOL) 2 MG/ML solution Take 0.5-1 mLs (1-2 mg total) by mouth every 6 (six) hours as needed for agitation. 08/07/14   Joseph Art, DO  methocarbamol (ROBAXIN) 500 MG tablet Take 1 tablet (500 mg total) by mouth every 6 (six) hours as needed for muscle spasms. 08/07/14   Joseph Art, DO  OLANZapine (ZYPREXA) 5 MG tablet Take 1 tablet (5 mg total) by mouth at bedtime. 08/07/14   Joseph Art, DO  oxyCODONE (ROXICODONE INTENSOL) 20 MG/ML concentrated solution Take 0.5 mLs (10 mg total) by mouth every 4 (four) hours as needed for breakthrough pain (or prior to rehab/therapy attempts). 08/07/14   Joseph Art, DO  polyethylene glycol (MIRALAX / GLYCOLAX) packet Take 17 g by mouth daily as needed for moderate constipation. 08/07/14   Joseph Art, DO  traZODone (DESYREL) 50 MG tablet Take 0.5 tablets (25 mg total) by mouth at bedtime as needed for sleep. 08/07/14   Jessica U Vann, DO   BP 135/76  mmHg  Pulse 85  Temp(Src) 98.5 F (36.9 C) (Oral)  SpO2 93% Physical Exam  Constitutional: She appears well-developed and well-nourished. No distress.  HENT:  Head: Normocephalic and atraumatic.  Left periorbital ecchymosis and swelling. There is an abrasion around the left eyebrow. There is another abrasion higher on the left forehead. Center this area extends deeper into the dermis. No active bleeding.   Eyes: Conjunctivae are normal. Right eye exhibits no discharge. Left eye exhibits no discharge.  Neck: Neck supple.  Cardiovascular: Normal rate, regular rhythm and normal heart sounds.  Exam reveals no gallop and no friction rub.   No murmur heard. Pulmonary/Chest: Effort normal and breath sounds normal. No respiratory distress.  Abdominal: Soft. She exhibits no distension. There is no tenderness.  Musculoskeletal: She exhibits no edema or tenderness.  No apparent midline spinal tenderness. No bony tenderness of the extremities. Patient does resist passive range of motion in her right shoulder. Her house reports that she has had persistent pain in her right upper extremity for some months now. I can't really appreciate any focal bony tenderness. There is no ecchymosis, swelling or other signs of acute trauma to this area. She reportedly fell onto her opposite side.  Neurological: She is alert.  Skin: Skin is warm and dry.  Psychiatric: She has a normal mood and affect. Her behavior is normal. Thought content normal.  Nursing note and vitals reviewed.   ED Course  Procedures (including critical care time) Labs Review Labs Reviewed  CBG MONITORING, ED - Abnormal; Notable for the following:    Glucose-Capillary 112 (*)    All other components within normal limits    Imaging Review No results found.   Dg Clavicle Right  12/03/2014   CLINICAL DATA:  Fall from wheelchair today.  Right shoulder pain.  EXAM: RIGHT CLAVICLE - 2+ VIEWS  COMPARISON:  07/27/2014  FINDINGS: There is a  healing fracture of the distal right clavicle with partial nonunion. This corresponds to acute fracture seen on 07/27/2014. No acute fractures are demonstrated on today's study. Coracoclavicular and acromioclavicular spaces are maintained.  IMPRESSION: Healing fracture of the distal right clavicle corresponding to acute fracture from 07/27/2014.   Electronically Signed   By: Burman Nieves M.D.   On: 12/03/2014 19:34   Dg Shoulder Right  12/03/2014   CLINICAL DATA:  Difficulty moving right arm.  Status post fall.  EXAM: RIGHT SHOULDER - 2+ VIEW  COMPARISON:  07/27/2014  FINDINGS: The glenohumeral joint is located. The humerus appears intact. There may be a nondisplaced fracture involving the distal aspect of the right clavicle.  IMPRESSION: 1. Glenohumeral joint appears located. 2. Suspect fracture involving the distal clavicle. Recommend dedicated imaging with clavicle series.   Electronically Signed   By: Signa Kell M.D.   On: 12/03/2014 17:20   Ct Head Wo Contrast  12/03/2014   CLINICAL DATA:  Patient with a witnessed fall from wheelchair in nursing home. Hematoma and laceration to left forehead.  EXAM: CT HEAD WITHOUT CONTRAST  CT CERVICAL SPINE WITHOUT CONTRAST  TECHNIQUE: Multidetector CT imaging of the head and cervical spine was performed following the standard protocol without intravenous contrast. Multiplanar CT image reconstructions of the cervical spine were also generated.  COMPARISON:  CT brain 07/27/2014  FINDINGS: CT HEAD FINDINGS  There is a large hematoma overlying the left frontal calvarium measuring 5.0 x 1.8 cm. Ventricles and sulci are prominent compatible with atrophy. Periventricular and subcortical white matter hypodensity compatible with chronic small vessel ischemic changes. No evidence for acute cortically based infarct, intracranial hemorrhage, mass lesion or mass-effect. Orbits are unremarkable. Mucosal thickening within the ethmoid air cells. Remainder the paranasal sinuses  are unremarkable. Mastoid air cells are well aerated. Calvarium is intact. No evidence for displaced skull fracture. Old right maxillary sinus fracture and right zygomatic arch fracture.  CT CERVICAL SPINE FINDINGS  Normal anatomic alignment. Relative preservation of the vertebral body and intervertebral disc space heights. Craniocervical junction is intact. No cervical spine fracture. Chronic right posterior third rib fracture with callus formation. Lung apices are  clear.  IMPRESSION: Large soft tissue hematoma overlies the left frontal calvarium without underlying skull fracture.  No acute intracranial process.  No acute cervical spine fracture.  Cortical atrophy and chronic small vessel ischemic changes.   Electronically Signed   By: Annia Belt M.D.   On: 12/03/2014 19:08   Ct Cervical Spine Wo Contrast  12/03/2014   CLINICAL DATA:  Patient with a witnessed fall from wheelchair in nursing home. Hematoma and laceration to left forehead.  EXAM: CT HEAD WITHOUT CONTRAST  CT CERVICAL SPINE WITHOUT CONTRAST  TECHNIQUE: Multidetector CT imaging of the head and cervical spine was performed following the standard protocol without intravenous contrast. Multiplanar CT image reconstructions of the cervical spine were also generated.  COMPARISON:  CT brain 07/27/2014  FINDINGS: CT HEAD FINDINGS  There is a large hematoma overlying the left frontal calvarium measuring 5.0 x 1.8 cm. Ventricles and sulci are prominent compatible with atrophy. Periventricular and subcortical white matter hypodensity compatible with chronic small vessel ischemic changes. No evidence for acute cortically based infarct, intracranial hemorrhage, mass lesion or mass-effect. Orbits are unremarkable. Mucosal thickening within the ethmoid air cells. Remainder the paranasal sinuses are unremarkable. Mastoid air cells are well aerated. Calvarium is intact. No evidence for displaced skull fracture. Old right maxillary sinus fracture and right zygomatic  arch fracture.  CT CERVICAL SPINE FINDINGS  Normal anatomic alignment. Relative preservation of the vertebral body and intervertebral disc space heights. Craniocervical junction is intact. No cervical spine fracture. Chronic right posterior third rib fracture with callus formation. Lung apices are clear.  IMPRESSION: Large soft tissue hematoma overlies the left frontal calvarium without underlying skull fracture.  No acute intracranial process.  No acute cervical spine fracture.  Cortical atrophy and chronic small vessel ischemic changes.   Electronically Signed   By: Annia Belt M.D.   On: 12/03/2014 19:08   I have personally reviewed and evaluated these images and lab results as part of my medical decision-making.   EKG Interpretation None      MDM   Final diagnoses:  Facial abrasion, initial encounter  Head injury, initial encounter    69 year old female with facial/head trauma after fall. Husband describes mechanical mechanism. Glucose fine. No concerning findings on EKG. No loss of consciousness. Limited review of systems secondary to advanced dementia. Her husband reports that she is at her baseline mental status though. There is no vomiting. She does not seem tender in her midline spine, but given unreliable review of systems, will CT her cervical spine in addition to her head. Appears to have pain with range of motion of her right shoulder although in speaking with husband and this seems for chronic in nature. Described mechanism doesn't really support an acute traumatic injury to this area but will obtain some plain films. Currently wearing a fentanyl patch. We'll give some additional pain medicine. Deep abrasion to the left upper forehead. Not amenable to suturing. Should heal fine with local wound care.   Shoulder films with healing distal R clavicle fx. Noted back in April. No point tenderness today.    Raeford Razor, MD 12/10/14 [phone]

## 2014-12-03 NOTE — ED Notes (Signed)
[facility] has been called 58. ETA is extended

## 2014-12-03 NOTE — Discharge Instructions (Signed)

## 2014-12-03 NOTE — ED Notes (Signed)
PT here from [facility] via [place] for witnessed fall from wheelchair.  Staff stated that pt began leaning in chair and then fell forward, her head taking the impact.  No loc.  Pt's mental baseline is demented.  Pt keeps c/o pain to head where large hematoma is above L forehead.

## 2014-12-09 DIAGNOSIS — Z515 Encounter for palliative care: Secondary | ICD-10-CM | POA: Insufficient documentation

## 2015-11-09 DEATH — deceased

## 2016-07-02 IMAGING — DX DG SHOULDER 2+V*R*
2 series · 2 of 2 positions shown · non-contrast
Comparison: None

CLINICAL DATA: Fell this morning, bruising and pain RIGHT shoulder

EXAM:
RIGHT SHOULDER - 2+ VIEW

[shoulder grashey]
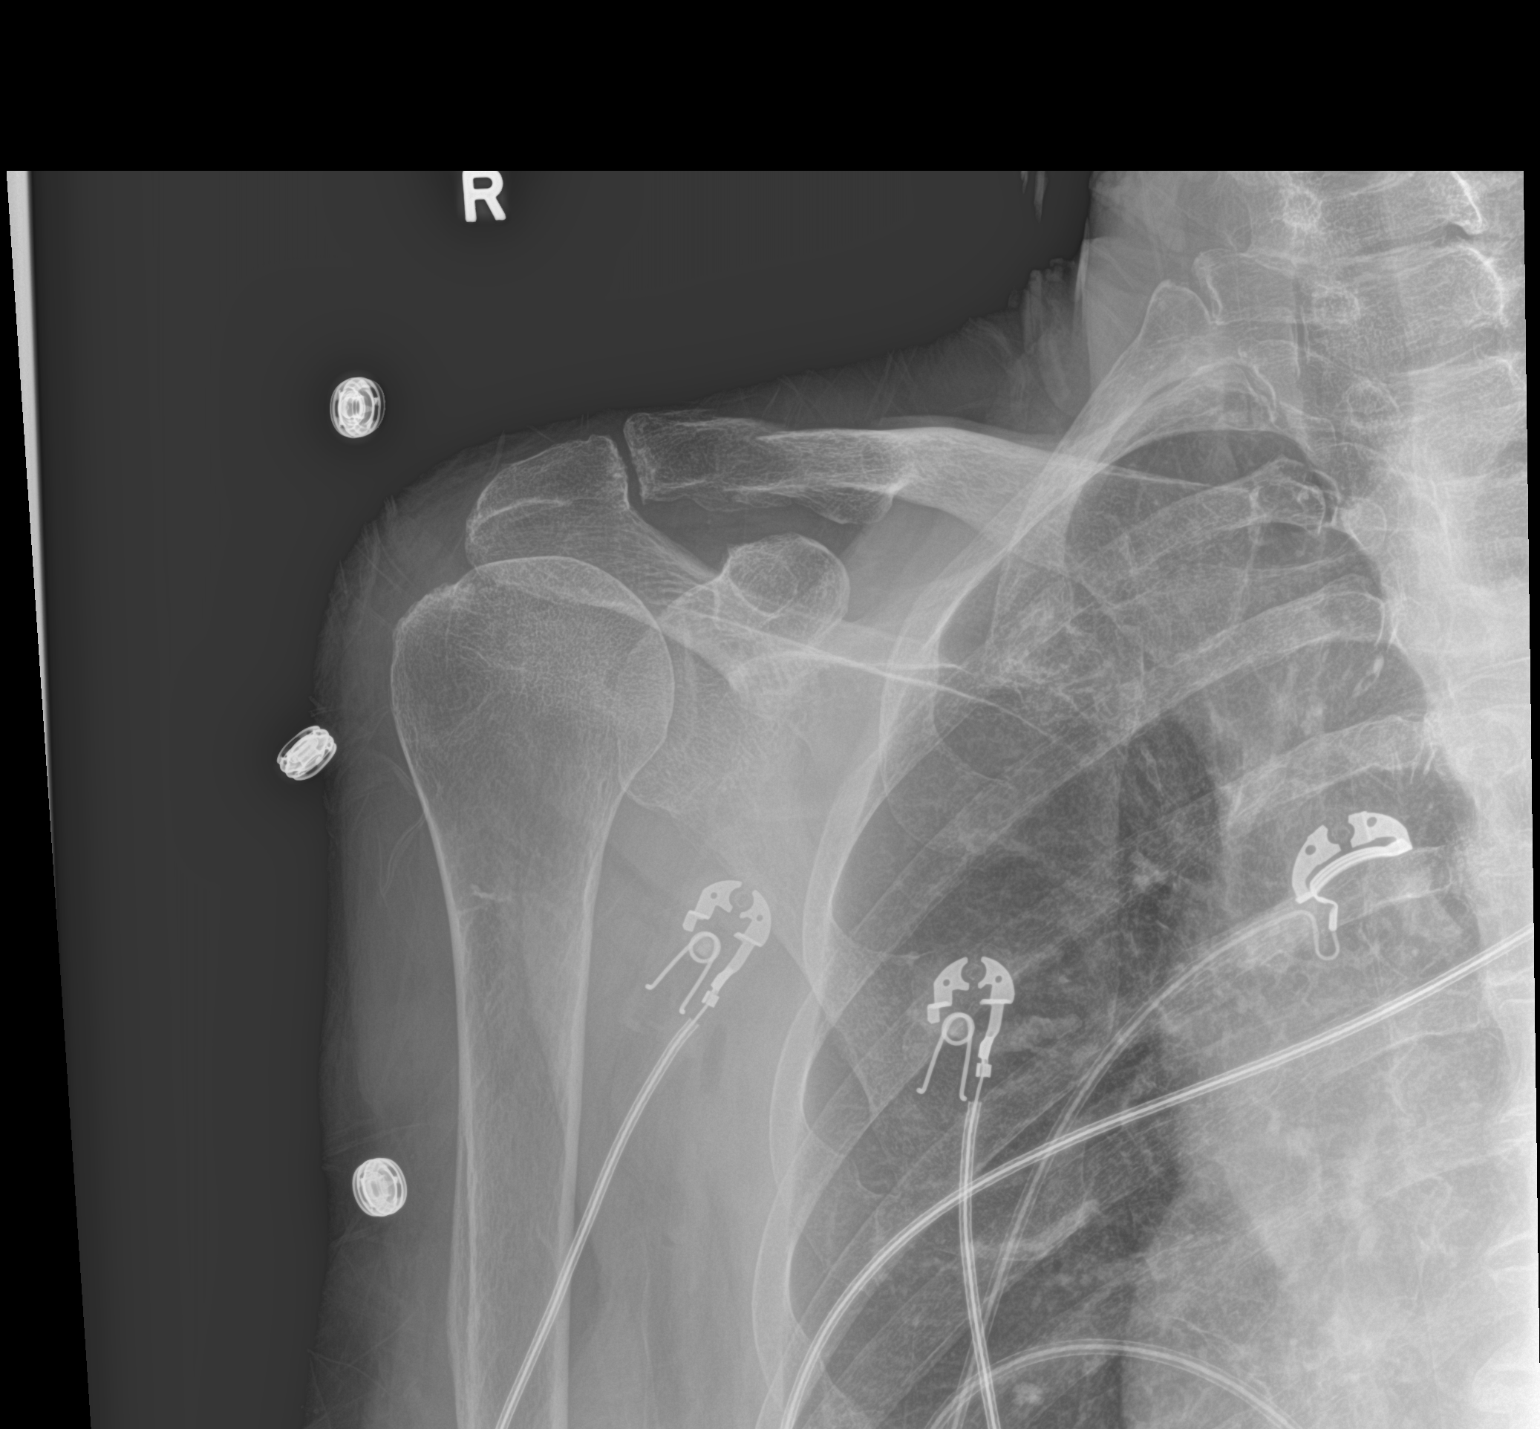

[shoulder y view]
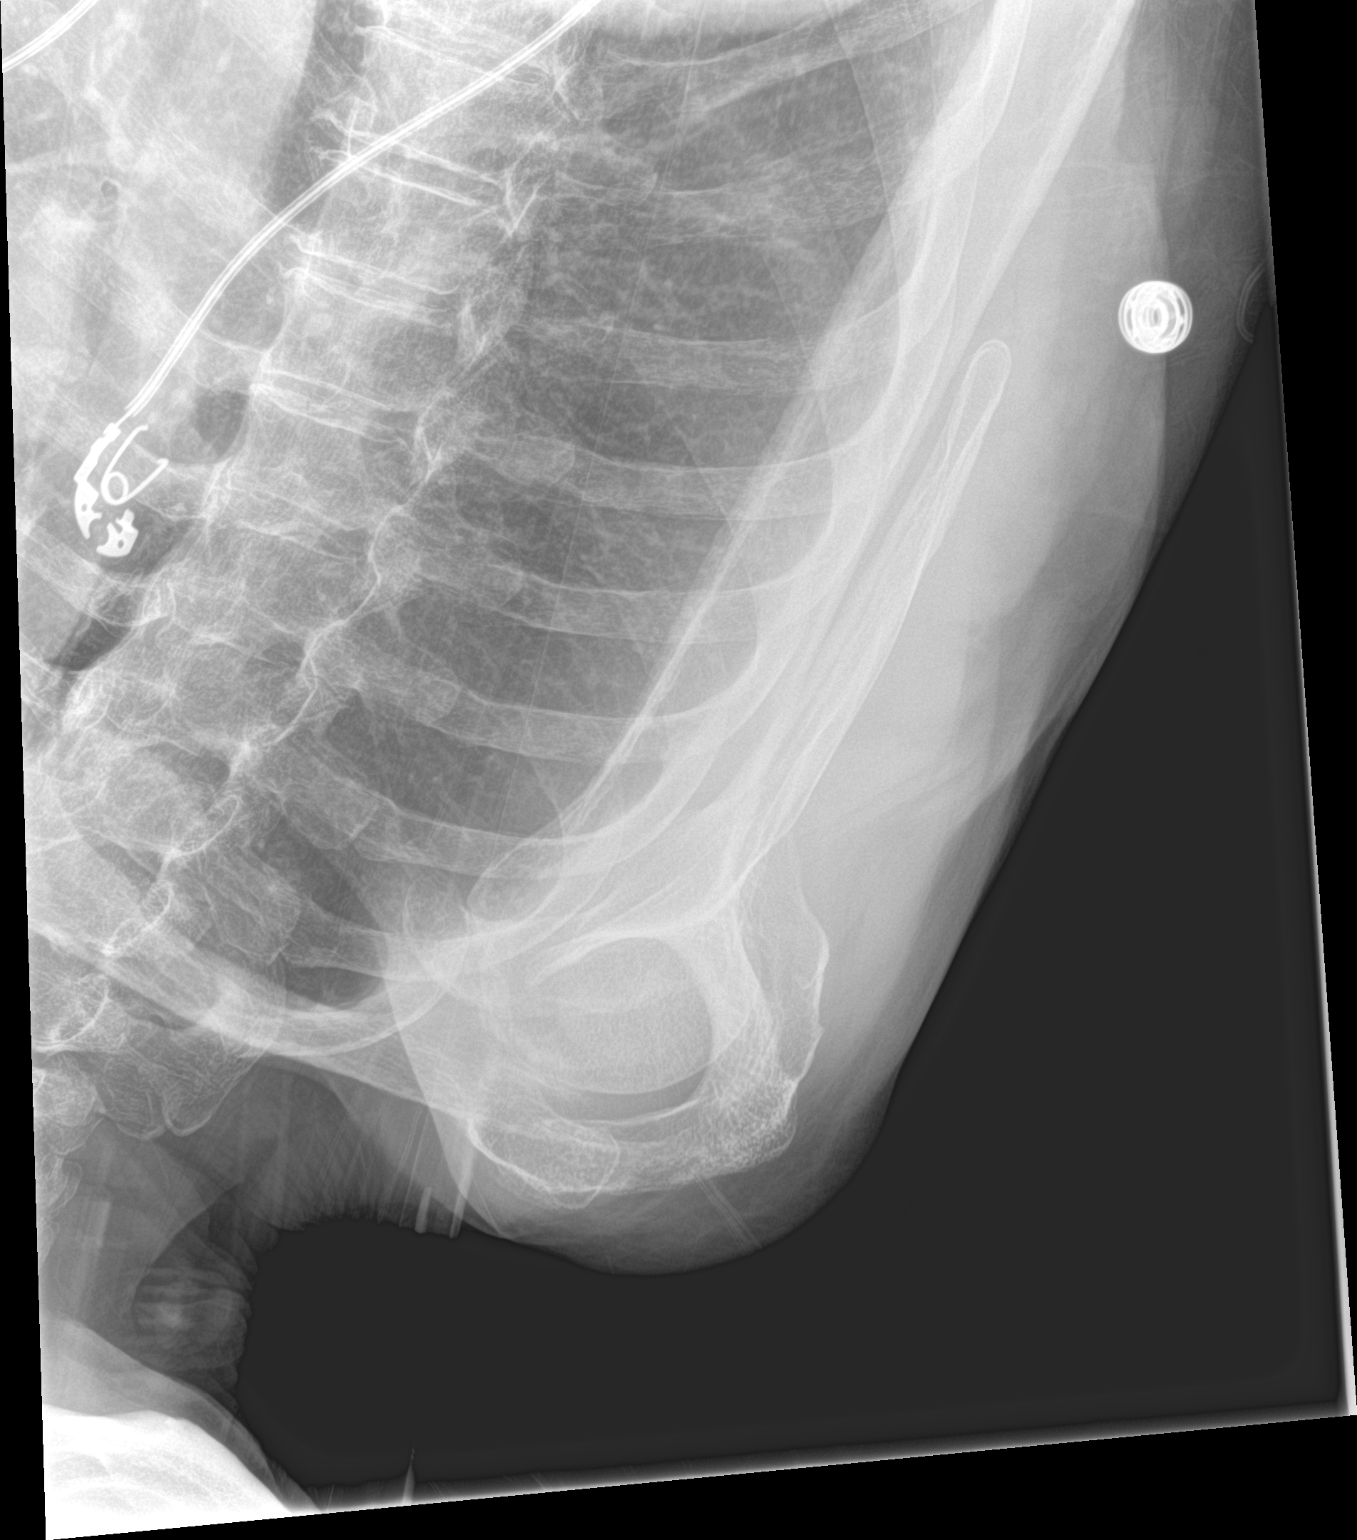

[2 of 2 positions shown; findings below may reference images not displayed]

FINDINGS: Diffuse osseous demineralization.

AC joint alignment normal.

Slight irregularity of the distal clavicle question
age-indeterminate distal RIGHT clavicular fracture.

No additional fracture, dislocation or bone destruction.

Visualized RIGHT ribs intact.
IMPRESSION: Osseous demineralization with questionable age-indeterminate distal
RIGHT clavicular fracture.

No additional focal bony abnormality.

## 2016-07-02 IMAGING — CT CT MAXILLOFACIAL W/O CM
3 series · 15 of 47 positions shown, 18 images · non-contrast
Comparison: None.

CLINICAL DATA: The patient fell and has right facial trauma with a
laceration and facial fractures.

EXAM:
CT MAXILLOFACIAL WITHOUT CONTRAST
TECHNIQUE: Multidetector CT imaging of the maxillofacial structures was
performed. Multiplanar CT image reconstructions were also generated.
A small metallic BB was placed on the right temple in order to
reliably differentiate right from left.

[Series 2: facial/ orbits 2.0 h30s · axial · 0.37mm/px · z∈[-200,-60]mm · 9 of 82 slices shown, 12 images]
[im 6/82  brain]
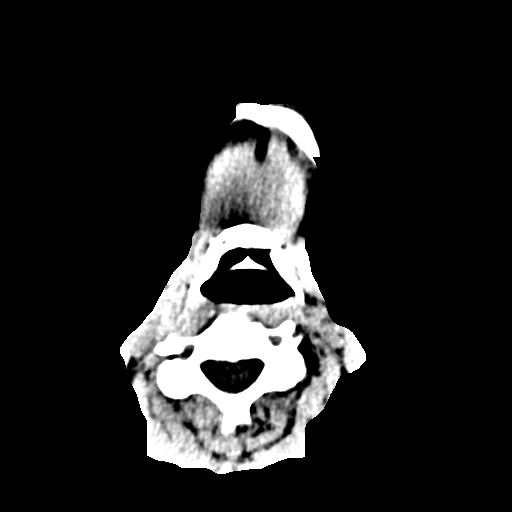
[im 6/82  bone]
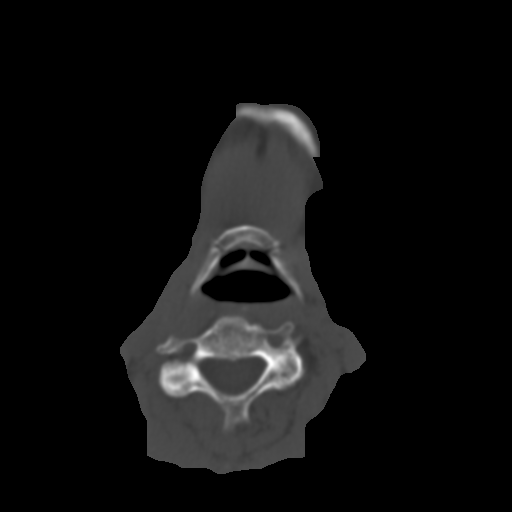
[im 14/82  bone]
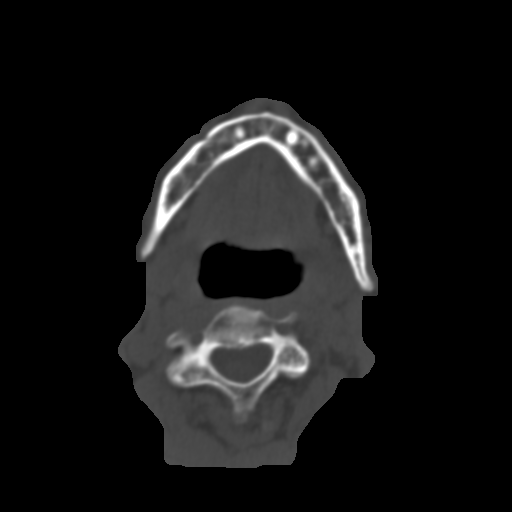
[im 23/82  bone]
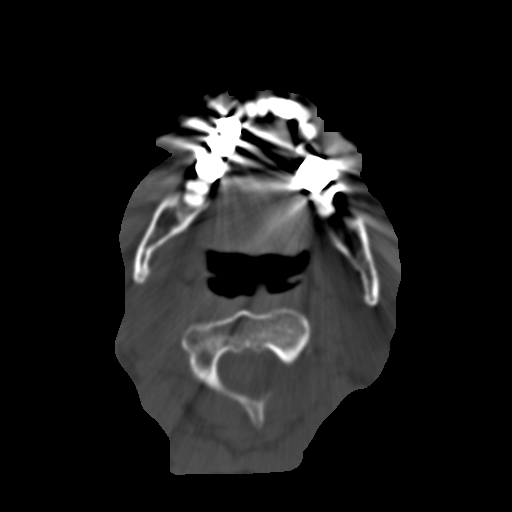
[im 31/82  bone]
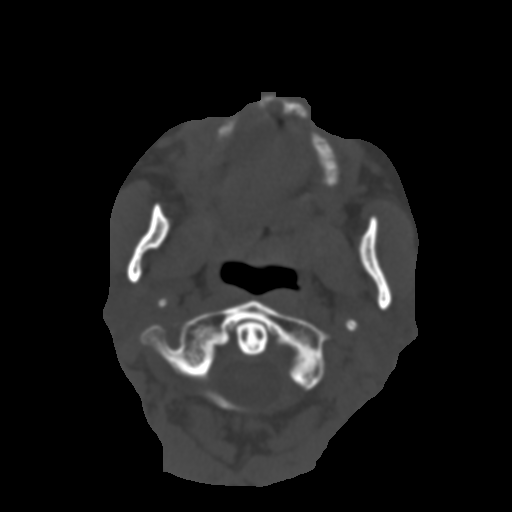
[im 42/82  brain]
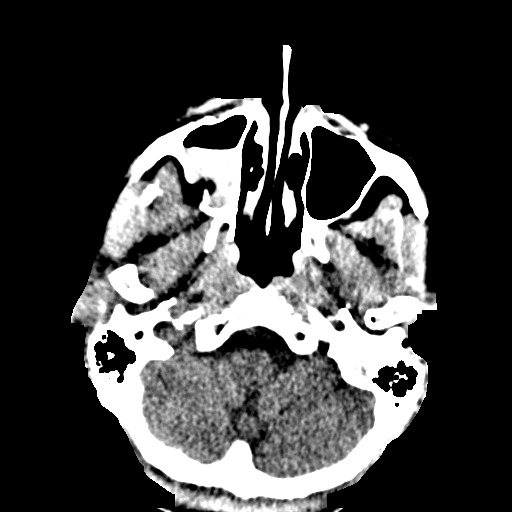
[im 42/82  bone]
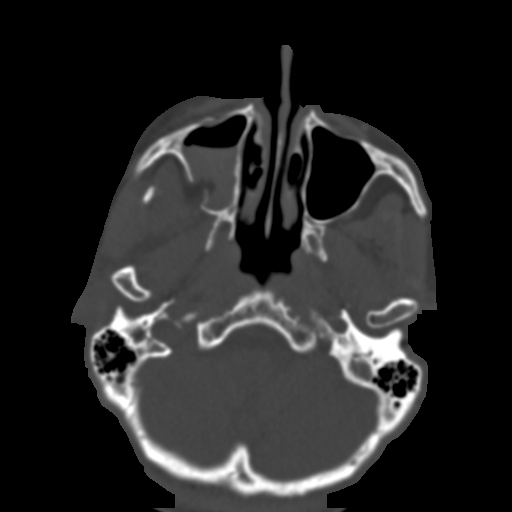
[im 51/82  bone]
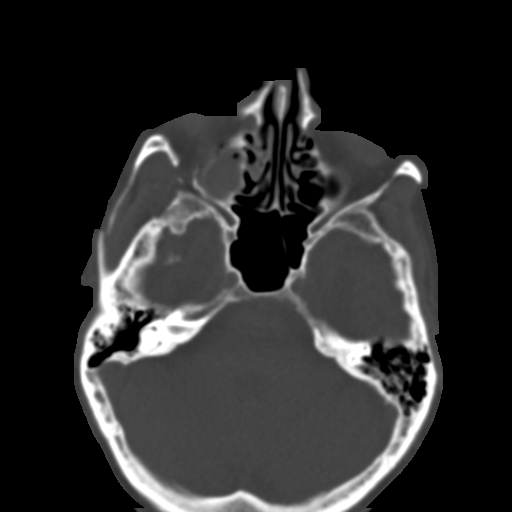
[im 59/82  bone]
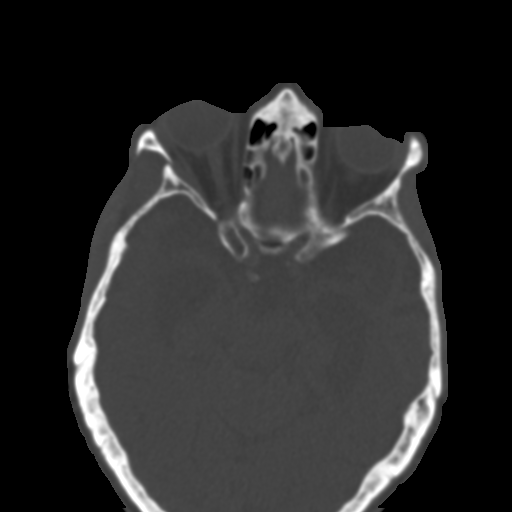
[im 68/82  bone]
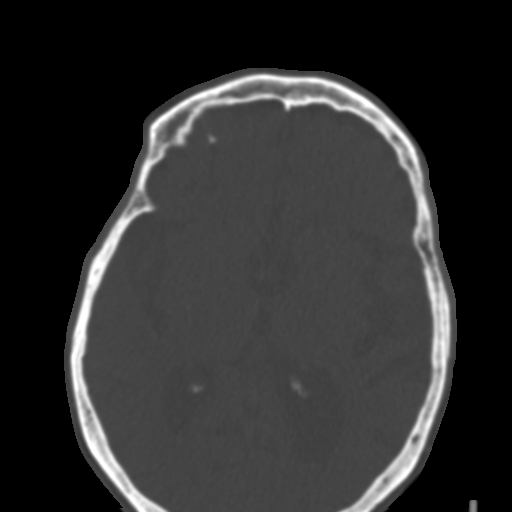
[im 76/82  brain]
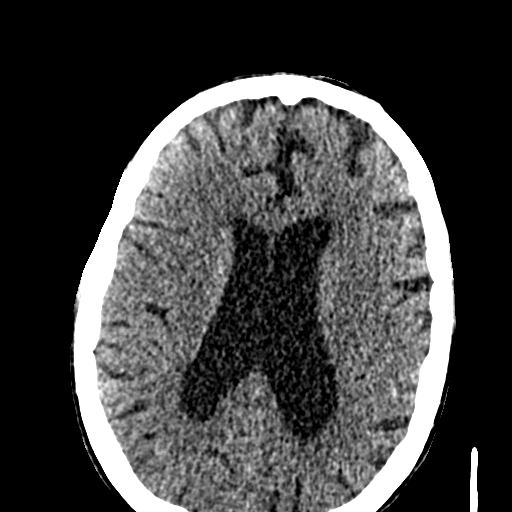
[im 76/82  bone]
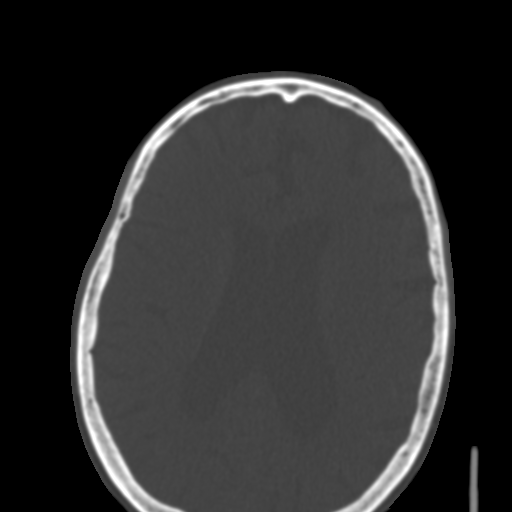

[Series 6: coronal soft tissue · coronal · 0.32mm/px · 3 of 74 slices shown]
[im 25/74  bone]
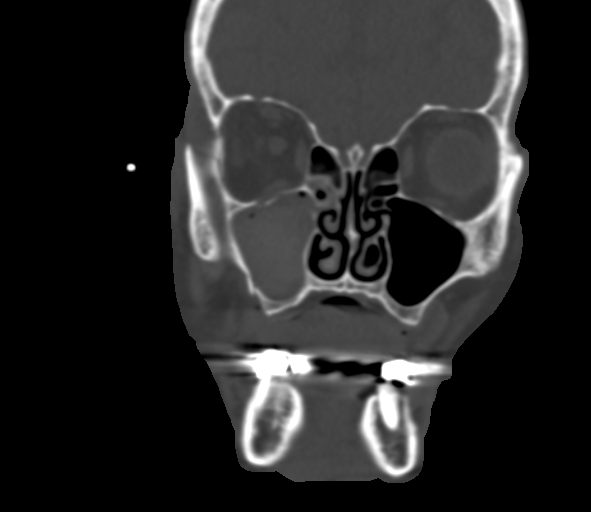
[im 33/74  bone]
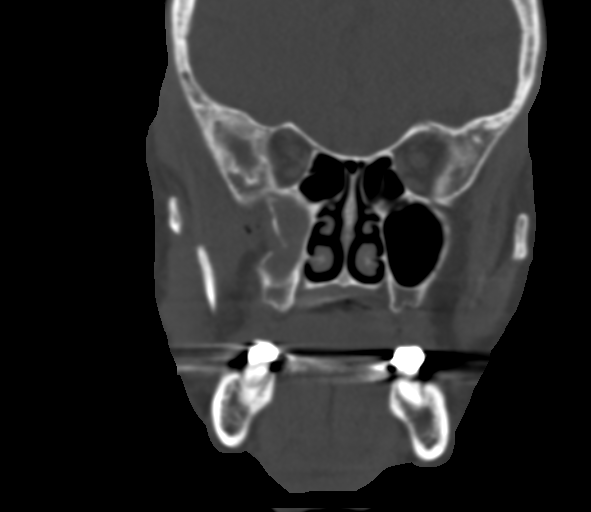
[im 41/74  bone]
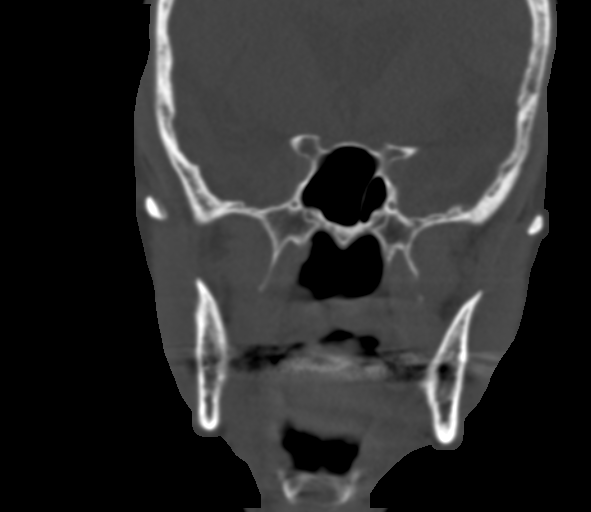

[Series 7: sagittal soft tissue · sagittal · 0.30mm/px · 3 of 75 slices shown]
[im 25/75  bone]
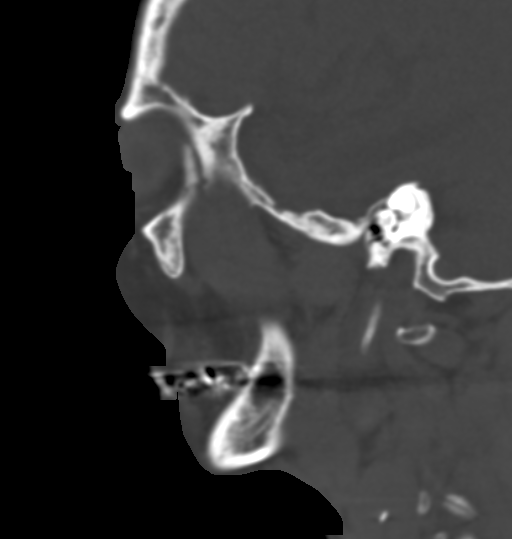
[im 38/75  bone]
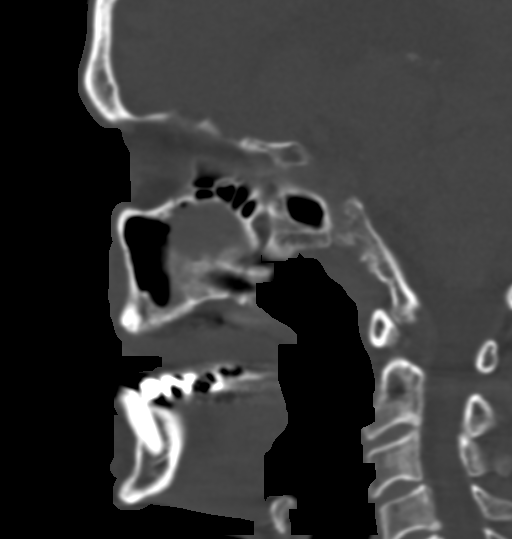
[im 50/75  bone]
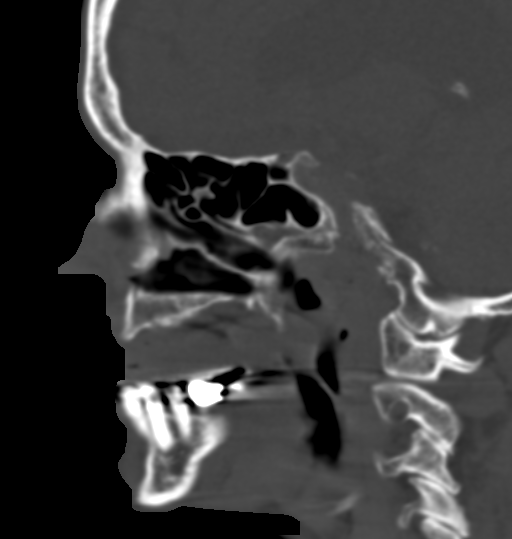

[15 of 47 positions shown; findings below may reference images not displayed]

FINDINGS: There is a tripod fracture of the right facial bones with a slightly
displaced fracture of the posterior aspect of the right zygomatic
arch, anterior and lateral wall maxillary sinus fractures,
nondisplaced hairline fractures through the medial wall of the right
maxillary sinus, and minimally depressed fracture of the floor of
the right orbit. The right inferior orbital rim appears to be
intact. There is no entrapment of extraocular muscles of the right
orbit. There is an blood fluid level in the right maxillary sinus.

Fracture of the lateral wall of the right orbit.

The other facial bones are normal.

Note is made of severe temporal lobe atrophy.
IMPRESSION: Fractures of the anterior, medial, and posterior lateral walls of
the right maxillary sinus as well as of the right zygomatic arch and
of the lateral wall of the right orbit. No muscle entrapment.
